# Patient Record
Sex: Female | Born: 1965
Health system: Southern US, Community
[De-identification: ages and names within clinical notes are randomized; demographics above are authoritative.]

## PROBLEM LIST (undated history)

## (undated) DIAGNOSIS — E785 Hyperlipidemia, unspecified: Secondary | ICD-10-CM

## (undated) DIAGNOSIS — M329 Systemic lupus erythematosus, unspecified: Secondary | ICD-10-CM

## (undated) DIAGNOSIS — IMO0002 Reserved for concepts with insufficient information to code with codable children: Secondary | ICD-10-CM

## (undated) DIAGNOSIS — E079 Disorder of thyroid, unspecified: Secondary | ICD-10-CM

## (undated) HISTORY — DX: Reserved for concepts with insufficient information to code with codable children: IMO0002

## (undated) HISTORY — PX: TOTAL ABDOMINAL HYSTERECTOMY: SHX209

## (undated) HISTORY — DX: Disorder of thyroid, unspecified: E07.9

## (undated) HISTORY — PX: BREAST ENHANCEMENT SURGERY: SHX7

## (undated) HISTORY — PX: TUBAL LIGATION: SHX77

## (undated) HISTORY — PX: OTHER SURGICAL HISTORY: SHX169

## (undated) HISTORY — PX: CHOLECYSTECTOMY: SHX55

## (undated) HISTORY — DX: Hyperlipidemia, unspecified: E78.5

## (undated) HISTORY — DX: Systemic lupus erythematosus, unspecified: M32.9

---

## 1998-06-08 ENCOUNTER — Inpatient Hospital Stay (HOSPITAL_COMMUNITY): Admission: AD | Admit: 1998-06-08 | Discharge: 1998-06-08 | Payer: Self-pay | Admitting: *Deleted

## 1999-04-08 ENCOUNTER — Other Ambulatory Visit: Admission: RE | Admit: 1999-04-08 | Discharge: 1999-04-08 | Payer: Self-pay | Admitting: *Deleted

## 2000-05-21 ENCOUNTER — Other Ambulatory Visit: Admission: RE | Admit: 2000-05-21 | Discharge: 2000-05-21 | Payer: Self-pay | Admitting: *Deleted

## 2000-10-29 ENCOUNTER — Inpatient Hospital Stay (HOSPITAL_COMMUNITY): Admission: AD | Admit: 2000-10-29 | Discharge: 2000-10-29 | Payer: Self-pay | Admitting: Obstetrics and Gynecology

## 2000-10-29 ENCOUNTER — Encounter: Payer: Self-pay | Admitting: Obstetrics and Gynecology

## 2001-02-09 ENCOUNTER — Inpatient Hospital Stay: Admission: AD | Admit: 2001-02-09 | Discharge: 2001-02-09 | Payer: Self-pay | Admitting: Obstetrics & Gynecology

## 2001-04-13 ENCOUNTER — Inpatient Hospital Stay (HOSPITAL_COMMUNITY): Admission: AD | Admit: 2001-04-13 | Discharge: 2001-04-15 | Payer: Self-pay | Admitting: Obstetrics & Gynecology

## 2001-04-20 ENCOUNTER — Encounter: Admission: RE | Admit: 2001-04-20 | Discharge: 2001-05-20 | Payer: Self-pay | Admitting: Obstetrics & Gynecology

## 2001-06-10 ENCOUNTER — Other Ambulatory Visit: Admission: RE | Admit: 2001-06-10 | Discharge: 2001-06-10 | Payer: Self-pay | Admitting: *Deleted

## 2002-03-01 ENCOUNTER — Emergency Department (HOSPITAL_COMMUNITY): Admission: EM | Admit: 2002-03-01 | Discharge: 2002-03-01 | Payer: Self-pay | Admitting: Emergency Medicine

## 2002-03-01 ENCOUNTER — Encounter: Payer: Self-pay | Admitting: Emergency Medicine

## 2002-04-12 ENCOUNTER — Other Ambulatory Visit: Admission: RE | Admit: 2002-04-12 | Discharge: 2002-04-12 | Payer: Self-pay | Admitting: *Deleted

## 2002-10-21 ENCOUNTER — Inpatient Hospital Stay (HOSPITAL_COMMUNITY): Admission: AD | Admit: 2002-10-21 | Discharge: 2002-10-26 | Payer: Self-pay | Admitting: Obstetrics & Gynecology

## 2002-10-22 ENCOUNTER — Encounter (INDEPENDENT_AMBULATORY_CARE_PROVIDER_SITE_OTHER): Payer: Self-pay | Admitting: *Deleted

## 2002-10-22 ENCOUNTER — Encounter: Payer: Self-pay | Admitting: Obstetrics & Gynecology

## 2002-11-03 ENCOUNTER — Inpatient Hospital Stay (HOSPITAL_COMMUNITY): Admission: AD | Admit: 2002-11-03 | Discharge: 2002-11-03 | Payer: Self-pay | Admitting: *Deleted

## 2002-12-19 ENCOUNTER — Other Ambulatory Visit: Admission: RE | Admit: 2002-12-19 | Discharge: 2002-12-19 | Payer: Self-pay | Admitting: *Deleted

## 2003-10-04 ENCOUNTER — Ambulatory Visit (HOSPITAL_COMMUNITY): Admission: RE | Admit: 2003-10-04 | Discharge: 2003-10-04 | Payer: Self-pay | Admitting: Neurology

## 2004-01-14 ENCOUNTER — Other Ambulatory Visit: Admission: RE | Admit: 2004-01-14 | Discharge: 2004-01-14 | Payer: Self-pay | Admitting: *Deleted

## 2005-03-30 ENCOUNTER — Other Ambulatory Visit: Admission: RE | Admit: 2005-03-30 | Discharge: 2005-03-30 | Payer: Self-pay | Admitting: Obstetrics and Gynecology

## 2005-07-17 ENCOUNTER — Encounter (INDEPENDENT_AMBULATORY_CARE_PROVIDER_SITE_OTHER): Payer: Self-pay | Admitting: Specialist

## 2005-07-17 ENCOUNTER — Ambulatory Visit (HOSPITAL_COMMUNITY): Admission: RE | Admit: 2005-07-17 | Discharge: 2005-07-17 | Payer: Self-pay | Admitting: Orthopedic Surgery

## 2005-07-17 ENCOUNTER — Ambulatory Visit (HOSPITAL_BASED_OUTPATIENT_CLINIC_OR_DEPARTMENT_OTHER): Admission: RE | Admit: 2005-07-17 | Discharge: 2005-07-17 | Payer: Self-pay | Admitting: Orthopedic Surgery

## 2006-05-31 ENCOUNTER — Emergency Department (HOSPITAL_COMMUNITY): Admission: EM | Admit: 2006-05-31 | Discharge: 2006-05-31 | Payer: Self-pay | Admitting: Emergency Medicine

## 2006-07-04 ENCOUNTER — Emergency Department (HOSPITAL_COMMUNITY): Admission: EM | Admit: 2006-07-04 | Discharge: 2006-07-05 | Payer: Self-pay | Admitting: Emergency Medicine

## 2007-07-21 ENCOUNTER — Encounter: Admission: RE | Admit: 2007-07-21 | Discharge: 2007-07-21 | Payer: Self-pay | Admitting: Obstetrics and Gynecology

## 2008-07-19 ENCOUNTER — Encounter: Admission: RE | Admit: 2008-07-19 | Discharge: 2008-07-19 | Payer: Self-pay | Admitting: Obstetrics and Gynecology

## 2008-08-15 ENCOUNTER — Encounter: Admission: RE | Admit: 2008-08-15 | Discharge: 2008-08-15 | Payer: Self-pay | Admitting: Family Medicine

## 2009-08-01 ENCOUNTER — Encounter: Admission: RE | Admit: 2009-08-01 | Discharge: 2009-08-01 | Payer: Self-pay | Admitting: Obstetrics and Gynecology

## 2010-08-04 ENCOUNTER — Encounter: Admission: RE | Admit: 2010-08-04 | Discharge: 2010-08-04 | Payer: Self-pay | Admitting: Obstetrics and Gynecology

## 2010-12-27 ENCOUNTER — Encounter: Payer: Self-pay | Admitting: *Deleted

## 2010-12-29 ENCOUNTER — Encounter: Payer: Self-pay | Admitting: Family Medicine

## 2011-04-24 NOTE — Op Note (Signed)
NAME:  [name], [name]                           ACCOUNT NO.:  [id number]   MEDICAL RECORD NO.:  [id number]                   PATIENT TYPE:  INP   LOCATION:  9148                                 FACILITY:  [facility]   PHYSICIAN:  Freddy Finner, M.D.                DATE OF BIRTH:  12-15-1965   DATE OF PROCEDURE:  10/22/2002  DATE OF DISCHARGE:                                 OPERATIVE REPORT   PREOPERATIVE DIAGNOSES:  1. Intrauterine pregnancy at 38-1/2 weeks' gestation.  2. Intrapartum hemorrhage.   POSTOPERATIVE DIAGNOSES:  1. Intrauterine pregnancy at 38-1/2 weeks' gestation.  2. Intrapartum hemorrhage.  3. Delivery of viable female infant with Apgar's 6 and 9 by neonatal     intensive care unit in attendance.  Cord pH 7.03   INDICATIONS FOR PROCEDURE:  The patient is a 45 year old, white, married  female, G4, P3 who presented in active labor.  At approximately 12:30 a.m.,  her request for epidural was made and this was placed.  The patient  apparently progressed very rapidly in labor.  She had a bleeding episode  which was fairly traumatic.  Dr. Colin Broach, who was in the hospital  visiting a patient two doors down, was summoned to the room after I was  called.  He ruptured her membranes with fluid that he thought was clear.  She was complete, but she was at a 0 station and he made the decision to  proceed with emergency cesarean.  I arrived approximately three minutes  after the birth of the infant at which time the infant was noted to be a  viable female with Apgar's of 6 and 9 were given by the NICU with cord pH of  7.03.  On my arrival, the patient was in excellent condition under epidural  anesthesia.  She was alert, oriented and animated.   DESCRIPTION OF PROCEDURE:  A lower transverse abdominal incision has been  made and carried sharply down to fascia in layers.  The fascia was then  extended in the transverse direction to the extended skin incision.  Rectus  muscles  had been divided in the midline.  Peritoneum was entered sharply.  The bladder flap was created with a sharp incision in the retroperitoneum  overlying the lower segment and was dissected off the lower segment.  A  transverse uterine incision had been made.  Placenta and other parts of  conception were removed from the uterus.  By Dr. Reynold Bowen description,  there was a small, marginal abruption.  I proceeded with completion of the  procedure at this point.  The uterine cavity was explored and found to be  empty.  The edges of the uterine incision were grasped with ring forceps.  The lower incision was closed with a first layer of running, locking 0  Vicryl.  The second layer with running, infiltrating 0 Monocryl.  One small  bleeder  a the left margin was controlled with figure-of-eight of 0 Monocryl.  Irrigation was carried out and hemostasis was complete. The bladder flap was  reapproximated with interrupted 0 Vicryl suture at the mid point of the  incision.  Tubes and ovaries were inspected and found to be normal.  The  uterus was normal.  At the patient's request, a tubal ligation was  performed.  This was confirmed appropriate by husband who signed the permit  due to her medicated status.  The tubes were elevated and the mid portion of  each tube was doubly ligated with 0 plain ties and a segment approximately 1  cm of tube excised.  The mucosal tips of the tubes distal to the ties were  fulgurated with the Bovie.  Hemostasis was adequate and complete.  The edges  of the peritoneal incision were grasped with Kelly's.  The abdominal  incision was then closed in layers.  Running 0 Monocryl was used to close  the peritoneum and reapproximate the rectus muscles.  The fascia was closed  with running, 0 PDS.  Subcutaneous tissue was closed with running, 2-0  plain.  Skin was closed with wide-skin staples and cornered with Steri-  Strips.  Estimated total intraoperative blood loss was 1000 cc  per  anesthesia.  The patient was taken to the recovery room in good condition.                                                Freddy Finner, M.D.    WRN/MEDQ  D:  10/22/2002  T:  10/22/2002  Job:  [id number]

## 2011-04-24 NOTE — Op Note (Signed)
NAMEKALANDRA, LOYE                           ACCOUNT NO.:  [id number]   MEDICAL RECORD NO.:  [id number]                   PATIENT TYPE:  INP   LOCATION:  9148                                 FACILITY:  [facility]   PHYSICIAN:  Timothy P. Fontaine, M.D.           DATE OF BIRTH:  [birth year]   DATE OF PROCEDURE:  10/22/2002  DATE OF DISCHARGE:                                 OPERATIVE REPORT   PROCEDURE:  While on labor and delivery attending another patient I was  asked to evaluate this patient, a patient of Dr. Varney Baas, who reportedly  had an uncomplicated labor until the acute onset of vaginal bleeding and  fetal bradycardia.  Dr. Jennette Kettle was summoned and while waiting his arrival I  was asked to evaluate the patient.  I found the patient on her side with  oxygen running, quickly evaluated her, found fetal heart rate in the 70s  with vaginal exam showing the patient to be complete, complete, with bulging  membranes.  The membranes were ruptured, the fluid noted to be clear, and  the infant was found to be in the vertex presentation at approximately a -1  to 0 station.  I asked the patient to push several times and it became  quickly apparent that there was not going to be a rapid descent to a  deliverable station vaginally and I quickly informed the patient that I felt  that we needed to proceed with an emergency cesarean section.  The patient  and her husband agreed with the plan and the patient was taken immediately  to the operating room, her epidural catheter being dosed upon arrival, the  abdomen quickly prepared with Betadine solution and draped in the usual  fashion.  The abdomen was sharply entered through a Pfannenstiel incision  achieving adequate hemostasis at all levels.  Bladder flap was sharply and  bluntly developed and the uterus was sharply entered in the lower uterine  segment and bluntly extended laterally.  The infant's head was then  delivered through the incision,  the nares and mouth suctioned, the rest of  the infant delivered, the cord doubly clamped and cut and the infant was  handed to pediatrics in attendance.  Samples of cord blood were obtained as  was an arterial cord pH and the placenta subsequently was spontaneously  extruded and noted to be intact.  I was unable to identify any gross areas  of abruption on the placenta although there was apparent dark old clotted  blood along the posterior lower uterine segment of the uterus.  The uterine  cavity was then explored with a sponge to remove all placental membrane  fragments and due to discomfort on the patient's level further surgery was  halted to allow the epidural to achieve maximum effect.  The patient was  also given 2 g Cefotan IV prophylaxis at this time due to the emergent  nature  of the cesarean.  Dr. Jennette Kettle arrived at this point and assumed primary  care of the patient and finished the procedure as the primary surgeon.  Apgars were later reported at 6 and 9 with an arterial cord pH of 7.06.                                               Timothy P. Audie Box, M.D.    TPF/MEDQ  D:  10/22/2002  T:  10/22/2002  Job:  [id number]

## 2011-04-24 NOTE — Op Note (Signed)
NAMECAPRIA, [name]                 ACCOUNT NO.:  [id number]   MEDICAL RECORD NO.:  [id number]          PATIENT TYPE:  AMB   LOCATION:  [facility]                         FACILITY:  [facility]   PHYSICIAN:  Marlowe Kays, M.D.  DATE OF BIRTH:  10-21-1966   DATE OF PROCEDURE:  07/17/2005  DATE OF DISCHARGE:                                 OPERATIVE REPORT   PREOPERATIVE DIAGNOSIS:  Ganglion cyst flexor radial right wrist.   POSTOPERATIVE DIAGNOSES:  Ganglion cyst flexor radial right wrist.   OPERATION:  Excision of ganglion flexor right wrist.   SURGEON:  Marlowe Kays, M.D.   ASSISTANT:  Nurse.   ANESTHESIA:  General.   PATHOLOGY AND JUSTIFICATION FOR PROCEDURE:  The cyst has been of long  standing perhaps as long as six months and is painful for her. She  understands because this is overlying the radial artery that we are doing  this under general anesthesia so I can release the tourniquet to control  bleeding postoperatively.   DESCRIPTION OF PROCEDURE:  Satisfied general anesthesia, pneumatic  tourniquet, right upper extremity was esmarched nonsterilely and prepped  from mid forearm to fingertips with DuraPrep, draped in a sterile field. I  made a vertical incision along the cyst with a combination of blunt and  sharp dissection. I isolated the cyst. It was intimately involved with a  small arterial branch and I dissected it out and then followed the cyst down  to where it appeared to take origin from the carpometacarpal joint. After  excising the cyst at its base, I then closed the aperture as carefully as I  could to avoid any arterial sticks and because there was sparse tissue with  interrupted 3-0 Vicryl. I then infiltrated the soft tissues with 1/2% plain  Marcaine and released the tourniquet. Several small minor bleeders were  coagulated with bipolar cautery. I then continued the closure with  interrupted 3-0 Vicryl subcutaneous tissue and running 4-0 nylon on the  skin.  Betadine Adaptic dry sterile dressing and volar plaster splint were  applied. She tolerated the procedure well and was taken to the recovery room  in satisfactory condition with no known complications.       JA/MEDQ  D:  07/17/2005  T:  07/17/2005  Job:  [id number]

## 2011-04-24 NOTE — Discharge Summary (Signed)
[name], [name]                           ACCOUNT NO.:  [id number]   MEDICAL RECORD NO.:  [id number]                   PATIENT TYPE:  INP   LOCATION:  9148                                 FACILITY:  [facility]   PHYSICIAN:  Dineen Kid. Rana Snare, M.D.                 DATE OF BIRTH:  [birth year]   DATE OF ADMISSION:  10/21/2002  DATE OF DISCHARGE:  10/26/2002                                 DISCHARGE SUMMARY   ADMITTING DIAGNOSES:  1. Intrauterine pregnancy at 38-1/2 weeks estimated gestational age.  2. Intrapartum hemorrhage.  3. Desires permanent sterilization.   DISCHARGE DIAGNOSES:  1. Status post low transverse cesarean section.  2. Viable female infant.  3. Permanent sterilization.   PROCEDURE:  1. Primary low transverse cesarean section.  2. Bilateral tubal ligation.   REASON FOR ADMISSION:  Please see written H&P.   HOSPITAL COURSE:  The patient is a 45 year old white married female gravida  4, para 3 that presented to [facility] in active labor.  Epidural anesthesia was placed for pain management.  Fetal heart tones were  reassuring.  The patient progressed rapidly in labor.  The patient suddenly  had an acute onset of vaginal bleeding and fetal bradycardia was observed.  Oxygen was administered and patient was rotated to the lateral position.  Dr. Audie Box, attending obstetrician who was nearby was summoned to the  room.  Dr. Jennette Kettle, on-call physician, was notified.  Artificial rupture of  membranes was performed with clear fluid noted.  Fetus was in the vertex  presentation at a -1 to a 0 station.  The patient was pushing but slow gain  was noted in station.  Decision was made to proceed with a cesarean  delivery.  The patient was taken emergently to the operating room where  epidural anesthesia was dosed adequately to a surgical level.  A low  transverse incision was made with delivery of a viable female infant  weighing 6 pounds 3 ounces with Apgars of 6 at  one minute, 9 at five  minutes.  Arterial cord pH was 7.30.  A small marginal abruption was noted.  The patient tolerated procedure well and was taken to the recovery room in  stable condition.  On postoperative day one abdomen was noted to be  distended with positive bowel sounds.  Abdominal dressing was clean, dry,  and intact.  Vital signs were stable.  Laboratories revealed hemoglobin of  7.9, platelet count of 121,000, WBC count of 11.4.  Dulcolax suppository was  administered.  On postoperative day two abdomen was soft.  The patient  continued to complain of abdominal distention.  Uterus was firm and  nontender.  Soap suds enema was given with small results.  On postoperative  day three slight erythema was noted above the incisional site.  Fundus was  slightly tender to palpation.  Oral antibiotics were started and Dulcolax  suppository was repeated.  On postoperative day four patient had good return  of bowel function.  Fundus was firm with some continued tenderness noted to  palpation, but improving.  Erythema remained superior to the incisional  site.  Staples were left intact and patient was discharged home.   CONDITION ON DISCHARGE:  Stable.   DIET:  Regular, as tolerated.   ACTIVITY:  No heavy lifting.  No driving x2 weeks.  No vaginal entry.   FOLLOW UP:  The patient is to follow up in the office in two days for staple  removal.  Otherwise, she is to return back to the office in one to two weeks  for an incision check.  She is to call for temperature greater than 100  degrees, persistent nausea and vomiting, heavy vaginal bleeding, and/or  redness or drainage from the incisional site.   DISCHARGE MEDICATIONS:  1. Percocet 5/325 numbers 30 one p.o. q.4-6h. p.r.n. pain.  2. Motrin 600 mg numbers 30 one p.o. q.6h. p.r.n.  3. Keflex 500 mg one p.o. t.i.d. x10 days.  4. Prenatal vitamins one p.o. daily.  5. Niferex 150 mg numbers 30 one p.o. daily.  6. Colace one p.o. daily  p.r.n.     Julio Sicks, N.P.                        Dineen Kid Rana Snare, M.D.    CC/MEDQ  D:  12/01/2002  T:  12/01/2002  Job:  [id number]

## 2011-11-11 ENCOUNTER — Other Ambulatory Visit: Payer: Self-pay | Admitting: Obstetrics and Gynecology

## 2011-11-11 DIAGNOSIS — Z1231 Encounter for screening mammogram for malignant neoplasm of breast: Secondary | ICD-10-CM

## 2011-12-11 ENCOUNTER — Ambulatory Visit: Payer: Self-pay

## 2015-12-23 ENCOUNTER — Other Ambulatory Visit: Payer: Self-pay | Admitting: Obstetrics and Gynecology

## 2015-12-23 DIAGNOSIS — R928 Other abnormal and inconclusive findings on diagnostic imaging of breast: Secondary | ICD-10-CM

## 2015-12-26 ENCOUNTER — Ambulatory Visit
Admission: RE | Admit: 2015-12-26 | Discharge: 2015-12-26 | Disposition: A | Discharge status: Home / Self Care | Payer: Managed Care, Other (non HMO) | Source: Ambulatory Visit | Attending: Obstetrics and Gynecology | Admitting: Obstetrics and Gynecology

## 2015-12-26 DIAGNOSIS — R928 Other abnormal and inconclusive findings on diagnostic imaging of breast: Secondary | ICD-10-CM

## 2016-01-15 ENCOUNTER — Other Ambulatory Visit: Payer: Self-pay

## 2017-02-23 ENCOUNTER — Telehealth: Payer: Self-pay | Admitting: Cardiology

## 2017-02-23 NOTE — Telephone Encounter (Signed)
Called pt and left message asking pt to call back to updated Fm and Medical Hx.

## 2017-03-04 ENCOUNTER — Encounter: Payer: Self-pay | Admitting: Cardiology

## 2017-03-04 ENCOUNTER — Telehealth (HOSPITAL_COMMUNITY): Payer: Self-pay

## 2017-03-04 ENCOUNTER — Encounter (INDEPENDENT_AMBULATORY_CARE_PROVIDER_SITE_OTHER): Payer: Self-pay

## 2017-03-04 ENCOUNTER — Ambulatory Visit (INDEPENDENT_AMBULATORY_CARE_PROVIDER_SITE_OTHER): Payer: Managed Care, Other (non HMO) | Admitting: Cardiology

## 2017-03-04 VITALS — BP 126/68 | HR 72 | Ht 60.5 in | Wt 148.0 lb

## 2017-03-04 DIAGNOSIS — Z8249 Family history of ischemic heart disease and other diseases of the circulatory system: Secondary | ICD-10-CM | POA: Diagnosis not present

## 2017-03-04 DIAGNOSIS — E782 Mixed hyperlipidemia: Secondary | ICD-10-CM

## 2017-03-04 DIAGNOSIS — F17200 Nicotine dependence, unspecified, uncomplicated: Secondary | ICD-10-CM | POA: Diagnosis not present

## 2017-03-04 DIAGNOSIS — R0609 Other forms of dyspnea: Secondary | ICD-10-CM

## 2017-03-04 MED ORDER — ROSUVASTATIN CALCIUM 5 MG PO TABS: 5.0000 mg | ORAL_TABLET | Freq: Every day | ORAL | 3 refills | Status: DC

## 2017-03-04 NOTE — Telephone Encounter (Signed)
Encounter complete. 

## 2017-03-04 NOTE — Patient Instructions (Signed)
Medication Instructions:   START TAKING ROSUVASTATIN 5 MG ONCE DAILY    Labwork:  TODAY--CMET  AND LIPIDS     Testing/Procedures:  Your physician has requested that you have en exercise stress myoview. For further information please visit [email or web address] Please follow instruction sheet, as given. DR Delton SeeNELSON WOULD LIKE FOR THIS TO BE SCHEDULED TOMORROW IF POSSIBLE, EITHER IN OUR OFFICE OR AT OUR [facility] OFFICE      Follow-Up:  2 MONTHS WITH DR Delton SeeNELSON       If you need a refill on your cardiac medications before your next appointment, please call your pharmacy.

## 2017-03-04 NOTE — Progress Notes (Signed)
Cardiology Office Note    Date:  03/04/2017   ID:  EMPRISS VASUDEVAN, DOB [birth year], MRN [id number]  PCP:  Turner Daniels, MD  Cardiologist:  Tobias Alexander, MD  Referring physician:   Turner Daniels, MD   Chief complain: DOE, FH of premature CAD  History of Present Illness:  [name] is a 51 y.o. female with h/o 25 years of smoking, SLE, untreated hyperlipidemia who is coming with concern of DOE. She has noticed it in the last 1-2 years but attributed it to smoking. She has 5 children. She had hysterectomy in 2004, no period for > 10 years.  She is not physically active and has sedentary job. She feels some chest pressure while having DOE. No palpitations or syncope. No LE edema, orthopnea, PND. She became scared after her younger sister died in 02/10/2018sec to MI and SCD. She was not symptomatic prior to that.   Past Medical History:  Diagnosis Date  . Thyroid disease     Past Surgical History:  Procedure Laterality Date  . BREAST ENHANCEMENT SURGERY Bilateral   . CESAREAN SECTION    . CHOLECYSTECTOMY    . TOTAL ABDOMINAL HYSTERECTOMY    . TUBAL LIGATION      Current Medications: No outpatient prescriptions prior to visit.   No facility-administered medications prior to visit.      Allergies:   Patient has no allergy information on record.   Social History   Social History  . Marital status: Married    Spouse name: N/A  . Number of children: N/A  . Years of education: N/A   Social History Main Topics  . Smoking status: Former Games developer  . Smokeless tobacco: Never Used  . Alcohol use Yes     Comment: occasionally  . Drug use: No  . Sexual activity: Not Asked   Other Topics Concern  . None   Social History Narrative  . None     Family History:  The patient'sfamily history includes Cancer in her mother; Heart disease in her father.   ROS:   Please see the history of present illness.    ROS All other systems reviewed and are negative.   PHYSICAL  EXAM:   VS:  BP 126/68   Pulse 72   Ht 5' 0.5" (1.537 m)   Wt 148 lb (67.1 kg)   BMI 28.43 kg/m    GEN: Well nourished, well developed, in no acute distress  HEENT: normal  Neck: no JVD, carotid bruits, or masses Cardiac: RRR; no murmurs, rubs, or gallops,no edema  Respiratory:  clear to auscultation bilaterally, normal work of breathing GI: soft, nontender, nondistended, + BS MS: no deformity or atrophy  Skin: warm and dry, no rash Neuro:  Alert and Oriented x 3, Strength and sensation are intact Psych: euthymic mood, full affect  Wt Readings from Last 3 Encounters:  03/04/17 148 lb (67.1 kg)      Studies/Labs Reviewed:   EKG:  EKG is ordered today.  Personally reviewed. The ekg ordered today demonstrates SR, poor R wave progression in the anterior leads..  Recent Labs: No results found for requested labs within last 8760 hours.   Lipid Panel No results found for: CHOL, TRIG, HDL, CHOLHDL, VLDL, LDLCALC, LDLDIRECT  Additional studies/ records that were reviewed today include:      ASSESSMENT:    1. DOE (dyspnea on exertion)   2. Family history of early CAD   3. Smoking   4. Mixed  hyperlipidemia      PLAN:  In order of problems listed above:  1. DOE - suspicious for ischemia - the patient has several risk factors including FH of premature CAD, untreated HLP, smoking and SLE. We will schedule an exercise treadmill nuclear stress test to evaluate for ischemia.  2. Smoking - encouraged to quit, she is motivated. 3. HLP - start rosuvastatin 5 mg po daily, repeat lipids and CMP in 2 months.    Medication Adjustments/Labs and Tests Ordered: Current medicines are reviewed at length with the patient today.  Concerns regarding medicines are outlined above.  Medication changes, Labs and Tests ordered today are listed in the Patient Instructions below. Patient Instructions  Medication Instructions:   START TAKING ROSUVASTATIN 5 MG ONCE  DAILY    Labwork:  TODAY--CMET  AND LIPIDS     Testing/Procedures:  Your physician has requested that you have en exercise stress myoview. For further information please visit [email or web address] Please follow instruction sheet, as given. DR Delton See WOULD LIKE FOR THIS TO BE SCHEDULED TOMORROW IF POSSIBLE, EITHER IN OUR OFFICE OR AT OUR [facility] OFFICE      Follow-Up:  2 MONTHS WITH DR Delton See       If you need a refill on your cardiac medications before your next appointment, please call your pharmacy.      Signed, Tobias Alexander, MD  03/04/2017 10:27 PM    [facility] [address] [place], [place], [place]  [address] Phone: [phone]; Fax: [phone]

## 2017-03-05 ENCOUNTER — Inpatient Hospital Stay (HOSPITAL_COMMUNITY): Admission: RE | Admit: 2017-03-05 | Payer: Managed Care, Other (non HMO) | Source: Ambulatory Visit

## 2017-03-09 ENCOUNTER — Telehealth: Payer: Self-pay | Admitting: *Deleted

## 2017-03-09 DIAGNOSIS — Z8249 Family history of ischemic heart disease and other diseases of the circulatory system: Secondary | ICD-10-CM

## 2017-03-09 DIAGNOSIS — R0609 Other forms of dyspnea: Principal | ICD-10-CM

## 2017-03-09 NOTE — Telephone Encounter (Signed)
Pts GXT is scheduled for 04/07/17 at 1100.  myoview appt cancelled.  Pt aware of appt date and time by [facility] scheduling.

## 2017-03-09 NOTE — Telephone Encounter (Signed)
-----   Message from Lars Masson, MD sent at 03/09/2017 10:53 AM EDT ----- Regarding: RE: Denied Myoview for 03-12-17 [name], Could you change it? Thank you K  ----- Message ----- From: Britt Bolognese Sent: 03/09/2017  10:06 AM To: Lars Masson, MD, Loa Socks, LPN Subject: Denied Myoview for 03-12-17                      Dr. Delton See  Pt's exercise Celine Ahr was denied by insurance d/t pt can walk on treadmill (your clinical notes were uploaded to insurance company).  Do you want to canx MPI and change to GXT?  Thanks  Charmaine

## 2017-03-09 NOTE — Telephone Encounter (Signed)
Spoke with the pt and informed her that per Dr. Delton See and our Pre-cert Representative, the pts exercise myoview ordered has been denied by her insurance company, for they want her to perform a regular GXT instead.  Informed the pt that we will cancel her myoview appt, and someone from our [facility] dept will be calling her to arrange her GXT appt.  Went over GXT instructions with the pt via phone.  Informed the pt that she may take all her meds with this test, but she can't eat, drink, or smoke tobacco 4 hours prior to this test.  Advised the pt to wear comfortable exercise clothing.  Pt verbalized understanding and agrees with this plan.

## 2017-03-12 ENCOUNTER — Encounter (HOSPITAL_COMMUNITY): Payer: Managed Care, Other (non HMO)

## 2017-04-07 ENCOUNTER — Ambulatory Visit (INDEPENDENT_AMBULATORY_CARE_PROVIDER_SITE_OTHER): Payer: Managed Care, Other (non HMO)

## 2017-04-07 DIAGNOSIS — R0609 Other forms of dyspnea: Secondary | ICD-10-CM

## 2017-04-07 DIAGNOSIS — Z8249 Family history of ischemic heart disease and other diseases of the circulatory system: Secondary | ICD-10-CM

## 2017-04-07 LAB — EXERCISE TOLERANCE TEST
Estimated workload: 7 METS
Exercise duration (min): 5 min
Exercise duration (sec): 0 s
MPHR: 170 {beats}/min
Peak HR: 160 {beats}/min
Percent HR: 94 %
RPE: 17
Rest HR: 81 {beats}/min

## 2017-04-13 ENCOUNTER — Encounter: Payer: Self-pay | Admitting: Cardiology

## 2017-04-30 ENCOUNTER — Ambulatory Visit: Payer: Managed Care, Other (non HMO) | Admitting: Cardiology

## 2017-05-05 ENCOUNTER — Encounter: Payer: Self-pay | Admitting: Cardiology

## 2017-05-05 DIAGNOSIS — K219 Gastro-esophageal reflux disease without esophagitis: Secondary | ICD-10-CM

## 2017-05-05 DIAGNOSIS — Z720 Tobacco use: Secondary | ICD-10-CM

## 2017-05-05 DIAGNOSIS — D735 Infarction of spleen: Secondary | ICD-10-CM | POA: Diagnosis not present

## 2017-05-05 DIAGNOSIS — R109 Unspecified abdominal pain: Secondary | ICD-10-CM | POA: Diagnosis not present

## 2017-05-05 DIAGNOSIS — M329 Systemic lupus erythematosus, unspecified: Secondary | ICD-10-CM

## 2017-05-06 DIAGNOSIS — M329 Systemic lupus erythematosus, unspecified: Secondary | ICD-10-CM | POA: Diagnosis not present

## 2017-05-06 DIAGNOSIS — D735 Infarction of spleen: Secondary | ICD-10-CM | POA: Diagnosis not present

## 2017-05-06 DIAGNOSIS — Z720 Tobacco use: Secondary | ICD-10-CM | POA: Diagnosis not present

## 2017-05-06 DIAGNOSIS — K219 Gastro-esophageal reflux disease without esophagitis: Secondary | ICD-10-CM | POA: Diagnosis not present

## 2017-05-07 DIAGNOSIS — M329 Systemic lupus erythematosus, unspecified: Secondary | ICD-10-CM | POA: Diagnosis not present

## 2017-05-07 DIAGNOSIS — D735 Infarction of spleen: Secondary | ICD-10-CM | POA: Diagnosis not present

## 2017-05-07 DIAGNOSIS — K219 Gastro-esophageal reflux disease without esophagitis: Secondary | ICD-10-CM | POA: Diagnosis not present

## 2017-05-07 DIAGNOSIS — Z720 Tobacco use: Secondary | ICD-10-CM | POA: Diagnosis not present

## 2017-05-08 DIAGNOSIS — D735 Infarction of spleen: Secondary | ICD-10-CM | POA: Diagnosis not present

## 2017-05-08 DIAGNOSIS — M329 Systemic lupus erythematosus, unspecified: Secondary | ICD-10-CM | POA: Diagnosis not present

## 2017-05-08 DIAGNOSIS — Z720 Tobacco use: Secondary | ICD-10-CM | POA: Diagnosis not present

## 2017-05-08 DIAGNOSIS — K219 Gastro-esophageal reflux disease without esophagitis: Secondary | ICD-10-CM | POA: Diagnosis not present

## 2019-10-11 DIAGNOSIS — M328 Other forms of systemic lupus erythematosus: Secondary | ICD-10-CM | POA: Diagnosis not present

## 2019-10-11 DIAGNOSIS — F330 Major depressive disorder, recurrent, mild: Secondary | ICD-10-CM | POA: Diagnosis not present

## 2019-10-11 DIAGNOSIS — Z0000 Encounter for general adult medical examination without abnormal findings: Secondary | ICD-10-CM | POA: Diagnosis not present

## 2019-10-11 DIAGNOSIS — Z7901 Long term (current) use of anticoagulants: Secondary | ICD-10-CM | POA: Diagnosis not present

## 2019-10-11 DIAGNOSIS — M329 Systemic lupus erythematosus, unspecified: Secondary | ICD-10-CM | POA: Diagnosis not present

## 2019-10-11 DIAGNOSIS — F329 Major depressive disorder, single episode, unspecified: Secondary | ICD-10-CM | POA: Diagnosis not present

## 2019-10-11 DIAGNOSIS — M79641 Pain in right hand: Secondary | ICD-10-CM | POA: Diagnosis not present

## 2019-10-11 DIAGNOSIS — E039 Hypothyroidism, unspecified: Secondary | ICD-10-CM | POA: Diagnosis not present

## 2019-10-12 DIAGNOSIS — Z0000 Encounter for general adult medical examination without abnormal findings: Secondary | ICD-10-CM | POA: Diagnosis not present

## 2019-11-08 DIAGNOSIS — M7061 Trochanteric bursitis, right hip: Secondary | ICD-10-CM | POA: Diagnosis not present

## 2019-11-08 DIAGNOSIS — M65311 Trigger thumb, right thumb: Secondary | ICD-10-CM | POA: Diagnosis not present

## 2019-11-08 DIAGNOSIS — M3500 Sicca syndrome, unspecified: Secondary | ICD-10-CM | POA: Diagnosis not present

## 2019-11-08 DIAGNOSIS — M2550 Pain in unspecified joint: Secondary | ICD-10-CM | POA: Diagnosis not present

## 2019-11-08 DIAGNOSIS — M329 Systemic lupus erythematosus, unspecified: Secondary | ICD-10-CM | POA: Diagnosis not present

## 2019-11-09 DIAGNOSIS — Z1509 Genetic susceptibility to other malignant neoplasm: Secondary | ICD-10-CM | POA: Diagnosis not present

## 2019-11-09 DIAGNOSIS — Z8481 Family history of carrier of genetic disease: Secondary | ICD-10-CM | POA: Diagnosis not present

## 2019-11-09 DIAGNOSIS — Z1273 Encounter for screening for malignant neoplasm of ovary: Secondary | ICD-10-CM | POA: Diagnosis not present

## 2019-11-10 DIAGNOSIS — S8001XA Contusion of right knee, initial encounter: Secondary | ICD-10-CM | POA: Diagnosis not present

## 2019-11-15 ENCOUNTER — Telehealth: Payer: Self-pay | Admitting: Hematology and Oncology

## 2019-11-15 NOTE — Telephone Encounter (Signed)
Received a new hem referral from Dr. Corinna Capra at [facility] for hx of splenic infarct. Ms. Hirt returned my call and has been scheduled to see Dr. Lindi Adie on 12/21 at 345pm. She's been made aware to arrive 15 minutes early.

## 2019-11-26 NOTE — Progress Notes (Signed)
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 female is here because of a history of splenic infarct. She is currently on anticoagulation with warfarin. Recent MyRisk genetic testing showed a 12.5% lifetime risk of breast cancer and was positive for the MSH6 gene. She has an extensive family history of cancer, and breast cancer in her maternal aunt, maternal cousin, and paternal cousin. She presents to the clinic today for initial evaluation.   I reviewed her records extensively and collaborated the history with the patient.  MEDICAL HISTORY:  Past Medical History:  Diagnosis Date  . Thyroid disease     SURGICAL HISTORY: Past Surgical History:  Procedure Laterality Date  . BREAST ENHANCEMENT SURGERY Bilateral   . CESAREAN SECTION    . CHOLECYSTECTOMY    . TOTAL ABDOMINAL HYSTERECTOMY    . TUBAL LIGATION      SOCIAL HISTORY: Social History   Socioeconomic History  . Marital status: Married    Spouse name: Not on file  . Number of children: Not on file  . Years of education: Not on file  . Highest education level: Not on file  Occupational History  . Not on file  Tobacco Use  . Smoking status: Former Research scientist (life sciences)  . Smokeless tobacco: Never Used  Substance and Sexual Activity  . Alcohol use: Yes    Comment: occasionally  . Drug use: No  . Sexual activity: Not on file  Other Topics Concern  . Not on file  Social History Narrative  . Not on file   Social Determinants of Health   Financial Resource Strain:   . Difficulty of Paying Living Expenses: Not on file  Food Insecurity:   . Worried About Charity fundraiser in the Last Year: Not on file  . Ran Out of Food  in the Last Year: Not on file  Transportation Needs:   . Lack of Transportation (Medical): Not on file  . Lack of Transportation (Non-Medical): Not on file  Physical Activity:   . Days of Exercise per Week: Not on file  . Minutes of Exercise per Session: Not on file  Stress:   . Feeling of Stress : Not on file  Social Connections:   . Frequency of Communication with Friends and Family: Not on file  . Frequency of Social Gatherings with Friends and Family: Not on file  . Attends Religious Services: Not on file  . Active Member of Clubs or Organizations: Not on file  . Attends Archivist Meetings: Not on file  . Marital Status: Not on file  Intimate Partner Violence:   . Fear of Current or Ex-Partner: Not on file  . Emotionally Abused: Not on file  . Physically Abused: Not on file  . Sexually Abused: Not on file    FAMILY HISTORY: Family History  Problem Relation Age of Onset  . Cancer Mother   . Heart disease Father     ALLERGIES:  has no allergies on file.  MEDICATIONS:  Current Outpatient Medications  Medication Sig Dispense Refill  . hydroxychloroquine (PLAQUENIL) 200 MG tablet Take 400 mg by mouth daily.  3  . levothyroxine (SYNTHROID, LEVOTHROID) 100 MCG tablet Take 100 mcg by mouth daily.  1  .  rosuvastatin (CRESTOR) 5 MG tablet Take 1 tablet (5 mg total) by mouth daily. 90 tablet 3  . Vitamin D, Ergocalciferol, (DRISDOL) 50000 units CAPS capsule Take 50,000 Units by mouth once a week.  0   No current facility-administered medications for this visit.    REVIEW OF SYSTEMS:   Constitutional: Denies fevers, chills or abnormal night sweats Eyes: Denies blurriness of vision, double vision or watery eyes Ears, nose, mouth, throat, and face: Denies mucositis or sore throat Respiratory: Denies cough, dyspnea or wheezes Cardiovascular: Denies palpitation, chest discomfort or lower extremity swelling Gastrointestinal:  Denies nausea, heartburn or change in bowel  habits Skin: Denies abnormal skin rashes Lymphatics: Denies new lymphadenopathy or easy bruising Neurological:Denies numbness, tingling or new weaknesses Behavioral/Psych: Mood is stable, no new changes  Breast: Denies any palpable lumps or discharge All other systems were reviewed with the patient and are negative.  PHYSICAL EXAMINATION: ECOG PERFORMANCE STATUS: 1 - Symptomatic but completely ambulatory  Vitals:   11/27/19 1603  BP: 133/88  Pulse: 78  Resp: 18  Temp: 98.2 F (36.8 C)  SpO2: 99%   Filed Weights   11/27/19 1603  Weight: 140 lb (63.5 kg)    GENERAL:alert, no distress and comfortable SKIN: skin color, texture, turgor are normal, no rashes or significant lesions EYES: normal, conjunctiva are pink and non-injected, sclera clear OROPHARYNX:no exudate, no erythema and lips, buccal mucosa, and tongue normal  NECK: supple, thyroid normal size, non-tender, without nodularity LYMPH:  no palpable lymphadenopathy in the cervical, axillary or inguinal LUNGS: clear to auscultation and percussion with normal breathing effort HEART: regular rate & rhythm and no murmurs and no lower extremity edema ABDOMEN:abdomen soft, non-tender and normal bowel sounds Musculoskeletal:no cyanosis of digits and no clubbing  PSYCH: alert & oriented x 3 with fluent speech NEURO: no focal motor/sensory deficits  LABORATORY DATA:  I have reviewed the data as listed No results found for: WBC, HGB, HCT, MCV, PLT No results found for: NA, K, CL, CO2  RADIOGRAPHIC STUDIES: I have personally reviewed the radiological reports and agreed with the findings in the report.  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  Data for breast cancer and MSH6 is not conclusive. Cancer risk: Patient has elevated risk of endometrial and colorectal cancer primarily, as well as ovarian gastric and pancreatic cancers. Patient is planning to undergo a hysterectomy for uterine cancer risk reduction. She is also planning to undergo colonoscopy.  Recommendation: 1.  GI follow-up with colonoscopies for colorectal cancer screening 2.  Breast MRI is not indicated because a lifetime risk is less than 20%.  However I recommended that she get annual breast mammograms.  Splenic infarction: With a history of lupus?  Lupus anticoagulant/antiphospholipid antibody We will send blood work today for lupus anticoagulant.  If it is positive then she will remain on warfarin for life.  She will then contact her primary care physician to set up the PT/INR checkups. If it is negative then we can discontinue warfarin.  I discussed with her that the newer anticoagulants do not work well for antiphospholipid antibody syndrome. I will give her a call tomorrow with  the results of the lupus anticoagulant testing.  Return to clinic on an as-needed basis.  All questions were answered. The patient knows to call the clinic with any problems, questions or concerns.   Rulon Eisenmenger, MD, MPH 11/27/2019    I, Molly Dorshimer, am acting as scribe for Nicholas Lose, MD.  I have reviewed the above documentation for accuracy and completeness, and I agree with the above.

## 2019-11-27 ENCOUNTER — Inpatient Hospital Stay: Payer: BC Managed Care – PPO | Attending: Hematology and Oncology | Admitting: Hematology and Oncology

## 2019-11-27 ENCOUNTER — Other Ambulatory Visit: Payer: Self-pay

## 2019-11-27 ENCOUNTER — Inpatient Hospital Stay: Payer: BC Managed Care – PPO

## 2019-11-27 ENCOUNTER — Ambulatory Visit: Payer: BC Managed Care – PPO

## 2019-11-27 DIAGNOSIS — Z7901 Long term (current) use of anticoagulants: Secondary | ICD-10-CM | POA: Insufficient documentation

## 2019-11-27 DIAGNOSIS — Z1509 Genetic susceptibility to other malignant neoplasm: Secondary | ICD-10-CM | POA: Insufficient documentation

## 2019-11-27 DIAGNOSIS — D735 Infarction of spleen: Secondary | ICD-10-CM | POA: Diagnosis not present

## 2019-11-27 NOTE — Assessment & Plan Note (Signed)
MSH6 heterozygous (Lynch syndrome) Lifetime breast cancer risk 12.7% Family history: Mother lung cancer 9, father prostate cancer 1, maternal aunt breast cancer 58, 2 maternal cousins with breast cancer 70 and 57, maternal uncle lung cancer 34  Counseling: I discussed with the patient that her family history does not correlate with the gene mutation that we detected.  Data for breast cancer and MSH6 is not conclusive. Cancer risk: Patient has elevated risk of endometrial and colorectal cancer primarily, as well as ovarian gastric and pancreatic cancers.  Recommendation: 1.  GI follow-up with colonoscopies for colorectal cancer screening 2.  GYN follow-up closely for endometrial cancer screening 2.  Given the extensive family history of breast cancer, I recommended antiestrogen therapy with tamoxifen for 5 years. Breast MRI is not indicated because a lifetime risk is less than 20%.  However I recommended that she get annual breast mammograms.

## 2019-11-29 ENCOUNTER — Telehealth: Payer: Self-pay | Admitting: Hematology and Oncology

## 2019-11-29 DIAGNOSIS — Z01818 Encounter for other preprocedural examination: Secondary | ICD-10-CM | POA: Diagnosis not present

## 2019-11-29 LAB — DRVVT MIX: dRVVT Mix: 43.3 s — ABNORMAL HIGH (ref 0.0–40.4)

## 2019-11-29 LAB — LUPUS ANTICOAGULANT PANEL
DRVVT: 77.9 s — ABNORMAL HIGH (ref 0.0–47.0)
PTT Lupus Anticoagulant: 50 s (ref 0.0–51.9)

## 2019-11-29 LAB — DRVVT CONFIRM: dRVVT Confirm: 1.5 ratio — ABNORMAL HIGH (ref 0.8–1.2)

## 2019-11-29 NOTE — Telephone Encounter (Signed)
I informed the patient that the lupus anticoagulant testing was positive for antiphospholipid antibodies. I recommended that she remain on blood thinners for life. We are happy to see her in the future if necessary.

## 2019-12-07 DIAGNOSIS — Z1211 Encounter for screening for malignant neoplasm of colon: Secondary | ICD-10-CM | POA: Diagnosis not present

## 2019-12-07 DIAGNOSIS — Z1509 Genetic susceptibility to other malignant neoplasm: Secondary | ICD-10-CM | POA: Diagnosis not present

## 2019-12-07 DIAGNOSIS — K648 Other hemorrhoids: Secondary | ICD-10-CM | POA: Diagnosis not present

## 2019-12-07 DIAGNOSIS — Z13810 Encounter for screening for upper gastrointestinal disorder: Secondary | ICD-10-CM | POA: Diagnosis not present

## 2019-12-07 DIAGNOSIS — K635 Polyp of colon: Secondary | ICD-10-CM | POA: Diagnosis not present

## 2019-12-07 DIAGNOSIS — D124 Benign neoplasm of descending colon: Secondary | ICD-10-CM | POA: Diagnosis not present

## 2019-12-27 DIAGNOSIS — M65311 Trigger thumb, right thumb: Secondary | ICD-10-CM | POA: Diagnosis not present

## 2020-01-18 DIAGNOSIS — Z7901 Long term (current) use of anticoagulants: Secondary | ICD-10-CM | POA: Diagnosis not present

## 2020-01-25 DIAGNOSIS — Z7901 Long term (current) use of anticoagulants: Secondary | ICD-10-CM | POA: Diagnosis not present

## 2020-02-08 DIAGNOSIS — Z7901 Long term (current) use of anticoagulants: Secondary | ICD-10-CM | POA: Diagnosis not present

## 2020-02-15 DIAGNOSIS — Z7901 Long term (current) use of anticoagulants: Secondary | ICD-10-CM | POA: Diagnosis not present

## 2020-03-18 DIAGNOSIS — Z7901 Long term (current) use of anticoagulants: Secondary | ICD-10-CM | POA: Diagnosis not present

## 2020-03-25 DIAGNOSIS — Z7901 Long term (current) use of anticoagulants: Secondary | ICD-10-CM | POA: Diagnosis not present

## 2020-04-01 DIAGNOSIS — Z7901 Long term (current) use of anticoagulants: Secondary | ICD-10-CM | POA: Diagnosis not present

## 2020-04-15 DIAGNOSIS — Z7901 Long term (current) use of anticoagulants: Secondary | ICD-10-CM | POA: Diagnosis not present

## 2020-05-08 DIAGNOSIS — G8918 Other acute postprocedural pain: Secondary | ICD-10-CM | POA: Diagnosis not present

## 2020-05-08 DIAGNOSIS — Z1509 Genetic susceptibility to other malignant neoplasm: Secondary | ICD-10-CM | POA: Diagnosis not present

## 2020-05-14 DIAGNOSIS — Z7901 Long term (current) use of anticoagulants: Secondary | ICD-10-CM | POA: Diagnosis not present

## 2020-05-14 DIAGNOSIS — M329 Systemic lupus erythematosus, unspecified: Secondary | ICD-10-CM | POA: Diagnosis not present

## 2020-05-29 DIAGNOSIS — Z7901 Long term (current) use of anticoagulants: Secondary | ICD-10-CM | POA: Diagnosis not present

## 2020-06-11 DIAGNOSIS — Z7901 Long term (current) use of anticoagulants: Secondary | ICD-10-CM | POA: Diagnosis not present

## 2020-06-13 DIAGNOSIS — Z79899 Other long term (current) drug therapy: Secondary | ICD-10-CM | POA: Diagnosis not present

## 2020-06-24 DIAGNOSIS — Z7901 Long term (current) use of anticoagulants: Secondary | ICD-10-CM | POA: Diagnosis not present

## 2020-07-31 ENCOUNTER — Other Ambulatory Visit: Payer: Self-pay | Admitting: Obstetrics and Gynecology

## 2020-07-31 DIAGNOSIS — Z808 Family history of malignant neoplasm of other organs or systems: Secondary | ICD-10-CM | POA: Diagnosis not present

## 2020-07-31 DIAGNOSIS — S3729XA Other injury of bladder, initial encounter: Secondary | ICD-10-CM | POA: Diagnosis not present

## 2020-07-31 DIAGNOSIS — N9971 Accidental puncture and laceration of a genitourinary system organ or structure during a genitourinary system procedure: Secondary | ICD-10-CM | POA: Diagnosis not present

## 2020-07-31 DIAGNOSIS — Z1502 Genetic susceptibility to malignant neoplasm of ovary: Secondary | ICD-10-CM | POA: Diagnosis not present

## 2020-07-31 DIAGNOSIS — Z4002 Encounter for prophylactic removal of ovary: Secondary | ICD-10-CM | POA: Diagnosis not present

## 2020-07-31 DIAGNOSIS — N994 Postprocedural pelvic peritoneal adhesions: Secondary | ICD-10-CM | POA: Diagnosis not present

## 2020-07-31 DIAGNOSIS — D270 Benign neoplasm of right ovary: Secondary | ICD-10-CM | POA: Diagnosis not present

## 2020-07-31 DIAGNOSIS — Z1509 Genetic susceptibility to other malignant neoplasm: Secondary | ICD-10-CM | POA: Diagnosis not present

## 2020-08-01 DIAGNOSIS — T83031A Leakage of indwelling urethral catheter, initial encounter: Secondary | ICD-10-CM | POA: Diagnosis not present

## 2020-08-05 DIAGNOSIS — Z7901 Long term (current) use of anticoagulants: Secondary | ICD-10-CM | POA: Diagnosis not present

## 2020-08-09 DIAGNOSIS — S3729XA Other injury of bladder, initial encounter: Secondary | ICD-10-CM | POA: Diagnosis not present

## 2020-08-26 DIAGNOSIS — Z7901 Long term (current) use of anticoagulants: Secondary | ICD-10-CM | POA: Diagnosis not present

## 2020-09-10 DIAGNOSIS — R319 Hematuria, unspecified: Secondary | ICD-10-CM | POA: Diagnosis not present

## 2020-09-10 DIAGNOSIS — Z09 Encounter for follow-up examination after completed treatment for conditions other than malignant neoplasm: Secondary | ICD-10-CM | POA: Diagnosis not present

## 2020-09-18 DIAGNOSIS — M3500 Sicca syndrome, unspecified: Secondary | ICD-10-CM | POA: Diagnosis not present

## 2020-09-18 DIAGNOSIS — M79644 Pain in right finger(s): Secondary | ICD-10-CM | POA: Diagnosis not present

## 2020-09-18 DIAGNOSIS — M329 Systemic lupus erythematosus, unspecified: Secondary | ICD-10-CM | POA: Diagnosis not present

## 2020-09-18 DIAGNOSIS — M7061 Trochanteric bursitis, right hip: Secondary | ICD-10-CM | POA: Diagnosis not present

## 2020-10-02 DIAGNOSIS — M7712 Lateral epicondylitis, left elbow: Secondary | ICD-10-CM | POA: Diagnosis not present

## 2020-10-05 NOTE — Progress Notes (Deleted)
Cardiology Office Note    Date:  10/05/2020   ID:  [name], DOB 08-19-1966, MRN [id number]  PCP:  Candice Camp, MD  Cardiologist:  No primary care provider on file.  Electrophysiologist:  None   Chief Complaint: ***  History of Present Illness:   [name] is a 54 y.o. female with history of ***  Labwork independently reviewed:    Past Medical History:  Diagnosis Date  . Thyroid disease     Past Surgical History:  Procedure Laterality Date  . BREAST ENHANCEMENT SURGERY Bilateral   . CESAREAN SECTION    . CHOLECYSTECTOMY    . TOTAL ABDOMINAL HYSTERECTOMY    . TUBAL LIGATION      Current Medications: No outpatient medications have been marked as taking for the 10/08/20 encounter (Appointment) with Laurann Montana, PA-C.   ***   Allergies:   Patient has no allergy information on record.   Social History   Socioeconomic History  . Marital status: Married    Spouse name: Not on file  . Number of children: Not on file  . Years of education: Not on file  . Highest education level: Not on file  Occupational History  . Not on file  Tobacco Use  . Smoking status: Former Games developer  . Smokeless tobacco: Never Used  Vaping Use  . Vaping Use: Never used  Substance and Sexual Activity  . Alcohol use: Yes    Comment: occasionally  . Drug use: No  . Sexual activity: Not on file  Other Topics Concern  . Not on file  Social History Narrative  . Not on file   Social Determinants of Health   Financial Resource Strain:   . Difficulty of Paying Living Expenses: Not on file  Food Insecurity:   . Worried About Programme researcher, broadcasting/film/video in the Last Year: Not on file  . Ran Out of Food in the Last Year: Not on file  Transportation Needs:   . Lack of Transportation (Medical): Not on file  . Lack of Transportation (Non-Medical): Not on file  Physical Activity:   . Days of Exercise per Week: Not on file  . Minutes of Exercise per Session: Not on file  Stress:   .  Feeling of Stress : Not on file  Social Connections:   . Frequency of Communication with Friends and Family: Not on file  . Frequency of Social Gatherings with Friends and Family: Not on file  . Attends Religious Services: Not on file  . Active Member of Clubs or Organizations: Not on file  . Attends Banker Meetings: Not on file  . Marital Status: Not on file     Family History:  The patient's ***family history includes Cancer in her mother; Heart disease in her father.  ROS:   Please see the history of present illness. Otherwise, review of systems is positive for ***.  All other systems are reviewed and otherwise negative.    EKGs/Labs/Other Studies Reviewed:    Studies reviewed are outlined and summarized above. Reports included below if pertinent.  ***    EKG:  EKG is ordered today, personally reviewed, demonstrating ***  Recent Labs: No results found for requested labs within last 8760 hours.  Recent Lipid Panel No results found for: CHOL, TRIG, HDL, CHOLHDL, VLDL, LDLCALC, LDLDIRECT  PHYSICAL EXAM:    VS:  There were no vitals taken for this visit.  BMI: There is no height or weight on file  to calculate BMI.  GEN: Well nourished, well developed, in no acute distress HEENT: normocephalic, atraumatic Neck: no JVD, carotid bruits, or masses Cardiac: ***RRR; no murmurs, rubs, or gallops, no edema  Respiratory:  clear to auscultation bilaterally, normal work of breathing GI: soft, nontender, nondistended, + BS MS: no deformity or atrophy Skin: warm and dry, no rash Neuro:  Alert and Oriented x 3, Strength and sensation are intact, follows commands Psych: euthymic mood, full affect  Wt Readings from Last 3 Encounters:  11/27/19 140 lb (63.5 kg)  03/04/17 148 lb (67.1 kg)     ASSESSMENT & PLAN:   1. ***  Disposition: F/u with ***   Medication Adjustments/Labs and Tests Ordered: Current medicines are reviewed at length with the patient today.   Concerns regarding medicines are outlined above. Medication changes, Labs and Tests ordered today are summarized above and listed in the Patient Instructions accessible in Encounters.   Signed, Laurann Montana, PA-C  10/05/2020 1:42 PM    [facility] [address] [place], [place], [place]  [address] Phone: [phone]; Fax: [phone]

## 2020-10-08 ENCOUNTER — Ambulatory Visit: Payer: BC Managed Care – PPO | Admitting: Physician Assistant

## 2020-10-10 DIAGNOSIS — Z7901 Long term (current) use of anticoagulants: Secondary | ICD-10-CM | POA: Diagnosis not present

## 2020-10-13 NOTE — Progress Notes (Deleted)
Cardiology Office Note:    Date:  10/13/2020   ID:  [name], DOB [birth year], MRN [id number]  PCP:  Candice Camp, MD  [facility] Cardiologist:  No primary care provider on file.  [facility] Electrophysiologist:  None   Referring MD: Candice Camp, MD    History of Present Illness:    [name] is a 54 y.o. female with a hx of tobacco use, SLE, splenic infarct on warfarin and HLD who was referred by Dr. Rana Snare for further evaluation of her DOE.  Patient last saw Dr. Delton See in 02/2017 when she presented with DOE and mild chest pain with exertion. Exercise stress normal without electrocardiographic evidence of ischemia.  Past Medical History:  Diagnosis Date  . Thyroid disease     Past Surgical History:  Procedure Laterality Date  . BREAST ENHANCEMENT SURGERY Bilateral   . CESAREAN SECTION    . CHOLECYSTECTOMY    . TOTAL ABDOMINAL HYSTERECTOMY    . TUBAL LIGATION      Current Medications: No outpatient medications have been marked as taking for the 10/15/20 encounter (Appointment) with Meriam Sprague, MD.     Allergies:   Patient has no allergy information on record.   Social History   Socioeconomic History  . Marital status: Married    Spouse name: Not on file  . Number of children: Not on file  . Years of education: Not on file  . Highest education level: Not on file  Occupational History  . Not on file  Tobacco Use  . Smoking status: Former Games developer  . Smokeless tobacco: Never Used  Vaping Use  . Vaping Use: Never used  Substance and Sexual Activity  . Alcohol use: Yes    Comment: occasionally  . Drug use: No  . Sexual activity: Not on file  Other Topics Concern  . Not on file  Social History Narrative  . Not on file   Social Determinants of Health   Financial Resource Strain:   . Difficulty of Paying Living Expenses: Not on file  Food Insecurity:   . Worried About Programme researcher, broadcasting/film/video in the Last Year: Not on file  . Ran Out of Food  in the Last Year: Not on file  Transportation Needs:   . Lack of Transportation (Medical): Not on file  . Lack of Transportation (Non-Medical): Not on file  Physical Activity:   . Days of Exercise per Week: Not on file  . Minutes of Exercise per Session: Not on file  Stress:   . Feeling of Stress : Not on file  Social Connections:   . Frequency of Communication with Friends and Family: Not on file  . Frequency of Social Gatherings with Friends and Family: Not on file  . Attends Religious Services: Not on file  . Active Member of Clubs or Organizations: Not on file  . Attends Banker Meetings: Not on file  . Marital Status: Not on file     Family History: The patient's ***family history includes Cancer in her mother; Heart disease in her father.  ROS:   Please see the history of present illness.    *** All other systems reviewed and are negative.  EKGs/Labs/Other Studies Reviewed:    The following studies were reviewed today: ECG stress 2018:  Blood pressure demonstrated a normal response to exercise.  There was no ST segment deviation noted during stress.   Normal ETT no ischemia   EKG:  EKG is ***  ordered today.  The ekg ordered today demonstrates ***  Recent Labs: No results found for requested labs within last 8760 hours.  Recent Lipid Panel No results found for: CHOL, TRIG, HDL, CHOLHDL, VLDL, LDLCALC, LDLDIRECT   Risk Assessment/Calculations:   {Does this patient have ATRIAL FIBRILLATION?[phone]}   Physical Exam:    VS:  There were no vitals taken for this visit.    Wt Readings from Last 3 Encounters:  11/27/19 140 lb (63.5 kg)  03/04/17 148 lb (67.1 kg)     GEN: *** Well nourished, well developed in no acute distress HEENT: Normal NECK: No JVD; No carotid bruits LYMPHATICS: No lymphadenopathy CARDIAC: ***RRR, no murmurs, rubs, gallops RESPIRATORY:  Clear to auscultation without rales, wheezing or rhonchi  ABDOMEN: Soft,  non-tender, non-distended MUSCULOSKELETAL:  No edema; No deformity  SKIN: Warm and dry NEUROLOGIC:  Alert and oriented x 3 PSYCHIATRIC:  Normal affect   ASSESSMENT:    No diagnosis found. PLAN:    In order of problems listed above:  1. ***    Shared Decision Making/Informed Consent      Medication Adjustments/Labs and Tests Ordered: Current medicines are reviewed at length with the patient today.  Concerns regarding medicines are outlined above.  No orders of the defined types were placed in this encounter.  No orders of the defined types were placed in this encounter.   There are no Patient Instructions on file for this visit.   Signed, Meriam Sprague, MD  10/13/2020 7:47 PM    [facility]

## 2020-10-15 ENCOUNTER — Ambulatory Visit: Payer: BC Managed Care – PPO | Admitting: Cardiology

## 2020-11-04 DIAGNOSIS — Z7901 Long term (current) use of anticoagulants: Secondary | ICD-10-CM | POA: Diagnosis not present

## 2020-11-09 NOTE — Progress Notes (Signed)
Cardiology Office Note:    Date:  11/11/2020   ID:  MAEVIS DUMITRESCU, DOB [birth year], MRN [id number]  PCP:  Candice Camp, MD  [facility] Cardiologist:  Meriam Sprague, MD  [facility] Electrophysiologist:  None   Referring MD: Candice Camp, MD    History of Present Illness:    [name] is a 54 y.o. 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 Delton See in 2018 now referred back to clinic by Dr. Rana Snare to re-establish care.  During her last visit in 2018, the patient was complaining of DOE and chest pressure. Underwent exercise stress test where she achieved 7 METs; no electrocardiographic evidence of ischemia. Has not followed up since that time.  Today, the patient states that she stopped taking her cholesterol medication (Crestor) as it was making her have muscle cramps with it. She stopped it about 4 months ago and is concerned as her sister passed away at age 31 from a massive MI. Currently she denies any chest pain, shortness of breath, palpitations, orthopnea, and PND. Not very active at baseline but does not note exercise restrictions. Has history of splenic infarct in setting of SLE found to be lupus anticoagulant positive now on lifelong warfarin. INR monitored hematologist. No further blood clots since that time. No issues with bleeding.  Continues to smoke 1/2ppd since 54 years of age. Has strong family history of lung cancer in relatives who smoked.  Sister passed away in her 3 from MI and SCD. Father with CAD.    Past Medical History:  Diagnosis Date  . Thyroid disease     Past Surgical History:  Procedure Laterality Date  . BREAST ENHANCEMENT SURGERY Bilateral   . CESAREAN SECTION    . CHOLECYSTECTOMY    . TOTAL ABDOMINAL HYSTERECTOMY    . TUBAL LIGATION      Current Medications: Current Meds  Medication Sig  . hydroxychloroquine (PLAQUENIL) 200 MG tablet Take 400 mg by mouth daily.  Marland Kitchen levothyroxine  (SYNTHROID, LEVOTHROID) 100 MCG tablet Take 100 mcg by mouth daily.     Allergies:   Patient has no allergy information on record.   Social History   Socioeconomic History  . Marital status: Married    Spouse name: Not on file  . Number of children: Not on file  . Years of education: Not on file  . Highest education level: Not on file  Occupational History  . Not on file  Tobacco Use  . Smoking status: Former Games developer  . Smokeless tobacco: Never Used  Vaping Use  . Vaping Use: Never used  Substance and Sexual Activity  . Alcohol use: Yes    Comment: occasionally  . Drug use: No  . Sexual activity: Not on file  Other Topics Concern  . Not on file  Social History Narrative  . Not on file   Social Determinants of Health   Financial Resource Strain:   . Difficulty of Paying Living Expenses: Not on file  Food Insecurity:   . Worried About Programme researcher, broadcasting/film/video in the Last Year: Not on file  . Ran Out of Food in the Last Year: Not on file  Transportation Needs:   . Lack of Transportation (Medical): Not on file  . Lack of Transportation (Non-Medical): Not on file  Physical Activity:   . Days of Exercise per Week: Not on file  . Minutes of Exercise per Session: Not on file  Stress:   . Feeling of Stress : Not on file  Social Connections:   . Frequency of Communication with Friends and Family: Not on file  . Frequency of Social Gatherings with Friends and Family: Not on file  . Attends Religious Services: Not on file  . Active Member of Clubs or Organizations: Not on file  . Attends Banker Meetings: Not on file  . Marital Status: Not on file     Family History: The patient's family history includes Cancer in her mother; Heart disease in her father.  ROS:   Please see the history of present illness.    Review of Systems  Constitutional: Negative for chills, fever and malaise/fatigue.  HENT: Negative for congestion.   Eyes: Negative for blurred vision.   Respiratory: Negative for shortness of breath.   Cardiovascular: Negative for chest pain, palpitations, orthopnea, claudication, leg swelling and PND.  Gastrointestinal: Negative for heartburn, nausea and vomiting.  Genitourinary: Negative for hematuria.  Musculoskeletal: Negative for falls.  Neurological: Negative for dizziness and loss of consciousness.  Endo/Heme/Allergies: Negative for polydipsia.  Psychiatric/Behavioral: Negative for substance abuse.    EKGs/Labs/Other Studies Reviewed:    The following studies were reviewed today: Exercise stress 2018: ECG Baseline ECG exhibits normal sinus rhythm..  Stress Findings The patient exercised following the Bruce protocol.   The patient reported no symptoms during the stress test. The patient experienced no angina during the stress test.   The patient requested the test to be stopped. The test was stopped because  the patient complained of fatigue and shortness of breath.   Blood pressure and heart rate demonstrated a normal response to exercise. Blood pressure demonstrated a normal response to exercise. Overall, the patient's exercise capacity was mildly impaired.   85% of maximum heart rate was achieved after 4.2 minutes.  Recovery time:  5 minutes.  The patient's response to exercise was adequate for diagnosis.  Response to Stress There was no ST segment deviation noted during stress.  Arrhythmias during stress:  none.   Arrhythmias during recovery:  none.     There were no significant arrhythmias noted during the test.   ECG was interpretable.    ECG 2018: NSR; no ischemia, no block  EKG:  EKG is  ordered today.  The ekg ordered today demonstrates NSR with HR 81  Recent Labs: No results found for requested labs within last 8760 hours.  Recent Lipid Panel No results found for: CHOL, TRIG, HDL, CHOLHDL, VLDL, LDLCALC, LDLDIRECT    Physical Exam:    VS:  BP 110/82   Pulse 81   Ht 5' (1.524 m)   Wt 142 lb 9.6 oz (64.7  kg)   SpO2 97%   BMI 27.85 kg/m     Wt Readings from Last 3 Encounters:  11/11/20 142 lb 9.6 oz (64.7 kg)  11/27/19 140 lb (63.5 kg)  03/04/17 148 lb (67.1 kg)     GEN:  Well nourished, well developed in no acute distress HEENT: Normal NECK: No JVD; No carotid bruits CARDIAC: RRR, no murmurs, rubs, gallops RESPIRATORY:  Clear to auscultation without rales, wheezing or rhonchi  ABDOMEN: Soft, non-tender, non-distended MUSCULOSKELETAL:  No edema; No deformity  SKIN: Warm and dry NEUROLOGIC:  Alert and oriented x 3 PSYCHIATRIC:  Normal affect   ASSESSMENT:    1. Hyperlipidemia, unspecified hyperlipidemia type   2. DOE (dyspnea on exertion)   3. Family history of early CAD   4. Hypothyroidism, unspecified type  PLAN:    In order of problems listed above:  #Family history of premature CAD #HLD Patient's sister died at 27 from massive MI. She has been diagnosed with HLD but stopped taking crestor due to muscles aches. Denies any current anginal symptoms but is notably at high risk with family history, SLE with positive lupus anticoagulant, and current tobacco use.  -Check lipid profile and hemoglobin A1C for risk stratification -Check coronary calcium score -Start lipitor 20mg  daily -Extensive time spent talking about importance of tobacco cessation  #SLE with history of splenic infarct. Managed by hematology now on lifelong [facility] with warfarin. No further arterial or venous thrombosis since starting AC. -Continue lifelong warfarin -Management per Hematology  #Tobacco abuse: Patient current smoker currently smoking 1/2 ppd. Discussed importance of smoking cessation at length. -Counseled regarding the importance of smoking cessation  #Hypothyroidism: -Check TSH -Continue synthroid   Medication Adjustments/Labs and Tests Ordered: Current medicines are reviewed at length with the patient today.  Concerns regarding medicines are outlined above.  Orders Placed This  Encounter  Procedures  . CT CARDIAC SCORING  . TSH  . Lipid Profile  . HgB A1c  . EKG 12-Lead   Meds ordered this encounter  Medications  . atorvastatin (LIPITOR) 20 MG tablet    Sig: Take 1 tablet (20 mg total) by mouth daily.    Dispense:  30 tablet    Refill:  11    Patient to stop Rosuvastatin    Patient Instructions  Medication Instructions:  Your physician has recommended you make the following change in your medication: Stop Rosuvastatin Start Atorvastatin 20 mg by mouth daily  *If you need a refill on your cardiac medications before your next appointment, please call your pharmacy*   Lab Work: Your physician recommends that you return for lab work in a few days--TSH, Hemoglobin A1C, Lipid profile.  This will be fasting  If you have labs (blood work) drawn today and your tests are completely normal, you will receive your results only by: Marland Kitchen MyChart Message (if you have MyChart) OR . A paper copy in the mail If you have any lab test that is abnormal or we need to change your treatment, we will call you to review the results.   Testing/Procedures: Dr Shari Prows has recommended you have a Coronary Calcium score CT   Follow-Up: At [facility], you and your health needs are our priority.  As part of our continuing mission to provide you with exceptional heart care, we have created designated Provider Care Teams.  These Care Teams include your primary Cardiologist (physician) and Advanced Practice Providers (APPs -  Physician Assistants and Nurse Practitioners) who all work together to provide you with the care you need, when you need it.  We recommend signing up for the patient portal called "MyChart".  Sign up information is provided on this After Visit Summary.  MyChart is used to connect with patients for Virtual Visits (Telemedicine).  Patients are able to view lab/test results, encounter notes, upcoming appointments, etc.  Non-urgent messages can be sent to your  provider as well.   To learn more about what you can do with MyChart, go to [email or web address].    Your next appointment:   6 month(s)   The format for your next appointment:   In Person  Provider:   You may see Meriam Sprague, MD or one of the following Advanced Practice Providers on your designated Care Team:    Tereso Newcomer, PA-C  Vin Frankfort,  PA-C    Other Instructions      Signed, Meriam Sprague, MD  11/11/2020 9:55 AM     [facility]

## 2020-11-11 ENCOUNTER — Ambulatory Visit (INDEPENDENT_AMBULATORY_CARE_PROVIDER_SITE_OTHER): Payer: Self-pay | Admitting: Cardiology

## 2020-11-11 ENCOUNTER — Other Ambulatory Visit: Payer: Self-pay

## 2020-11-11 ENCOUNTER — Encounter: Payer: Self-pay | Admitting: Cardiology

## 2020-11-11 VITALS — BP 110/82 | HR 81 | Ht 60.0 in | Wt 142.6 lb

## 2020-11-11 DIAGNOSIS — E785 Hyperlipidemia, unspecified: Secondary | ICD-10-CM

## 2020-11-11 DIAGNOSIS — R0600 Dyspnea, unspecified: Secondary | ICD-10-CM

## 2020-11-11 DIAGNOSIS — E039 Hypothyroidism, unspecified: Secondary | ICD-10-CM

## 2020-11-11 DIAGNOSIS — Z8249 Family history of ischemic heart disease and other diseases of the circulatory system: Secondary | ICD-10-CM

## 2020-11-11 DIAGNOSIS — R0609 Other forms of dyspnea: Secondary | ICD-10-CM

## 2020-11-11 MED ORDER — ATORVASTATIN CALCIUM 20 MG PO TABS: 20.0000 mg | ORAL_TABLET | Freq: Every day | ORAL | 11 refills | Status: DC

## 2020-11-11 NOTE — Patient Instructions (Signed)
Medication Instructions:  Your physician has recommended you make the following change in your medication: Stop Rosuvastatin Start Atorvastatin 20 mg by mouth daily  *If you need a refill on your cardiac medications before your next appointment, please call your pharmacy*   Lab Work: Your physician recommends that you return for lab work in a few days--TSH, Hemoglobin A1C, Lipid profile.  This will be fasting  If you have labs (blood work) drawn today and your tests are completely normal, you will receive your results only by: Marland Kitchen MyChart Message (if you have MyChart) OR . A paper copy in the mail If you have any lab test that is abnormal or we need to change your treatment, we will call you to review the results.   Testing/Procedures: Dr Shari Prows has recommended you have a Coronary Calcium score CT   Follow-Up: At [facility], you and your health needs are our priority.  As part of our continuing mission to provide you with exceptional heart care, we have created designated Provider Care Teams.  These Care Teams include your primary Cardiologist (physician) and Advanced Practice Providers (APPs -  Physician Assistants and Nurse Practitioners) who all work together to provide you with the care you need, when you need it.  We recommend signing up for the patient portal called "MyChart".  Sign up information is provided on this After Visit Summary.  MyChart is used to connect with patients for Virtual Visits (Telemedicine).  Patients are able to view lab/test results, encounter notes, upcoming appointments, etc.  Non-urgent messages can be sent to your provider as well.   To learn more about what you can do with MyChart, go to [email or web address].    Your next appointment:   6 month(s)   The format for your next appointment:   In Person  Provider:   You may see Meriam Sprague, MD or one of the following Advanced Practice Providers on your designated Care Team:    Tereso Newcomer, PA-C  Chelsea Aus, [place]    Other Instructions

## 2020-11-12 ENCOUNTER — Other Ambulatory Visit: Payer: Self-pay

## 2020-11-12 DIAGNOSIS — E663 Overweight: Secondary | ICD-10-CM | POA: Diagnosis not present

## 2020-11-12 DIAGNOSIS — Z6827 Body mass index (BMI) 27.0-27.9, adult: Secondary | ICD-10-CM | POA: Diagnosis not present

## 2020-11-12 DIAGNOSIS — R319 Hematuria, unspecified: Secondary | ICD-10-CM | POA: Diagnosis not present

## 2020-11-14 ENCOUNTER — Other Ambulatory Visit: Payer: Self-pay

## 2020-11-14 ENCOUNTER — Other Ambulatory Visit: Payer: BC Managed Care – PPO | Admitting: *Deleted

## 2020-11-14 DIAGNOSIS — Z8249 Family history of ischemic heart disease and other diseases of the circulatory system: Secondary | ICD-10-CM | POA: Diagnosis not present

## 2020-11-14 DIAGNOSIS — E039 Hypothyroidism, unspecified: Secondary | ICD-10-CM | POA: Diagnosis not present

## 2020-11-14 DIAGNOSIS — E785 Hyperlipidemia, unspecified: Secondary | ICD-10-CM

## 2020-11-15 LAB — LIPID PANEL
Chol/HDL Ratio: 5 ratio — ABNORMAL HIGH (ref 0.0–4.4)
Cholesterol, Total: 265 mg/dL — ABNORMAL HIGH (ref 100–199)
HDL: 53 mg/dL (ref >39)
LDL Chol Calc (NIH): 187 mg/dL — ABNORMAL HIGH (ref 0–99)
Triglycerides: 140 mg/dL (ref 0–149)
VLDL Cholesterol Cal: 25 mg/dL (ref 5–40)

## 2020-11-15 LAB — HEMOGLOBIN A1C
Est. average glucose Bld gHb Est-mCnc: 105 mg/dL
Hgb A1c MFr Bld: 5.3 % (ref 4.8–5.6)

## 2020-11-15 LAB — TSH: TSH: 12.4 u[IU]/mL — ABNORMAL HIGH (ref 0.450–4.500)

## 2020-11-18 ENCOUNTER — Telehealth: Payer: Self-pay

## 2020-11-18 DIAGNOSIS — Z7901 Long term (current) use of anticoagulants: Secondary | ICD-10-CM | POA: Diagnosis not present

## 2020-11-18 MED ORDER — LEVOTHYROXINE SODIUM 125 MCG PO TABS: 125.0000 ug | ORAL_TABLET | Freq: Every day | ORAL | 3 refills | Status: DC

## 2020-11-18 NOTE — Telephone Encounter (Signed)
Pt advised and verbalized understanding and will send her labs to her PCP, Dr. Rana Snare.

## 2020-11-18 NOTE — Telephone Encounter (Signed)
-----   Message from Meriam Sprague, MD sent at 11/16/2020  3:57 PM EST ----- Her TSH level is very elevated which suggests her synthroid dosing is too low. Can we increase her to daily and ensure she follows up with her primary care physician to adjust dosing further as needed?  Her cholesterol is also high. Her bad cholesterol or LDL level is 187. We will check to see what her calcium score is as this will help determine how much plaque is in the blood vessels. She should definitely take the lipitor as she needs it at this time. This medication will really help lower her cholesterol levels. We will watch this very closely going forward.

## 2020-11-21 DIAGNOSIS — Z79899 Other long term (current) drug therapy: Secondary | ICD-10-CM | POA: Diagnosis not present

## 2020-12-02 ENCOUNTER — Telehealth: Payer: Self-pay

## 2020-12-02 ENCOUNTER — Other Ambulatory Visit: Payer: Self-pay

## 2020-12-02 ENCOUNTER — Ambulatory Visit (INDEPENDENT_AMBULATORY_CARE_PROVIDER_SITE_OTHER)
Admission: RE | Admit: 2020-12-02 | Discharge: 2020-12-02 | Disposition: A | Discharge status: Home / Self Care | Payer: Self-pay | Source: Ambulatory Visit | Attending: Cardiology | Admitting: Cardiology

## 2020-12-02 DIAGNOSIS — E785 Hyperlipidemia, unspecified: Secondary | ICD-10-CM

## 2020-12-02 DIAGNOSIS — Z8249 Family history of ischemic heart disease and other diseases of the circulatory system: Secondary | ICD-10-CM

## 2020-12-02 DIAGNOSIS — R0609 Other forms of dyspnea: Secondary | ICD-10-CM

## 2020-12-02 DIAGNOSIS — R0600 Dyspnea, unspecified: Secondary | ICD-10-CM

## 2020-12-02 NOTE — Telephone Encounter (Signed)
The patient has been notified of the result and verbalized understanding.  All questions (if any) were answered. Leanord Hawking, RN 12/02/2020 3:26 PM

## 2020-12-02 NOTE — Telephone Encounter (Signed)
-----   Message from Meriam Sprague, MD sent at 12/02/2020  3:16 PM EST ----- Her calcium score is elevated at 102. This represents the 96% for age and sex matched controls. It is really important that she try to quit smoking as this and her positive family history are her primary risk factors. She should continue the atorvastatin and if she is tolerating it, we will slowly titrate it up to decrease her risk of future events as much as possible.

## 2021-01-07 DIAGNOSIS — Z7901 Long term (current) use of anticoagulants: Secondary | ICD-10-CM | POA: Diagnosis not present

## 2021-01-08 DIAGNOSIS — R590 Localized enlarged lymph nodes: Secondary | ICD-10-CM | POA: Diagnosis not present

## 2021-01-08 DIAGNOSIS — M545 Low back pain, unspecified: Secondary | ICD-10-CM | POA: Diagnosis not present

## 2021-01-08 DIAGNOSIS — I700 Atherosclerosis of aorta: Secondary | ICD-10-CM | POA: Diagnosis not present

## 2021-01-08 DIAGNOSIS — N6323 Unspecified lump in the left breast, lower outer quadrant: Secondary | ICD-10-CM | POA: Diagnosis not present

## 2021-01-08 DIAGNOSIS — R791 Abnormal coagulation profile: Secondary | ICD-10-CM | POA: Diagnosis not present

## 2021-01-08 DIAGNOSIS — R3121 Asymptomatic microscopic hematuria: Secondary | ICD-10-CM | POA: Diagnosis not present

## 2021-01-08 DIAGNOSIS — R109 Unspecified abdominal pain: Secondary | ICD-10-CM | POA: Diagnosis not present

## 2021-01-08 DIAGNOSIS — R110 Nausea: Secondary | ICD-10-CM | POA: Diagnosis not present

## 2021-01-08 DIAGNOSIS — N6320 Unspecified lump in the left breast, unspecified quadrant: Secondary | ICD-10-CM | POA: Diagnosis not present

## 2021-01-08 DIAGNOSIS — R319 Hematuria, unspecified: Secondary | ICD-10-CM | POA: Diagnosis not present

## 2021-01-08 DIAGNOSIS — R1032 Left lower quadrant pain: Secondary | ICD-10-CM | POA: Diagnosis not present

## 2021-01-10 ENCOUNTER — Other Ambulatory Visit: Payer: Self-pay | Admitting: Obstetrics and Gynecology

## 2021-01-10 DIAGNOSIS — N63 Unspecified lump in unspecified breast: Secondary | ICD-10-CM

## 2021-01-14 DIAGNOSIS — Z7901 Long term (current) use of anticoagulants: Secondary | ICD-10-CM | POA: Diagnosis not present

## 2021-01-23 DIAGNOSIS — Z7901 Long term (current) use of anticoagulants: Secondary | ICD-10-CM | POA: Diagnosis not present

## 2021-01-30 DIAGNOSIS — Z7901 Long term (current) use of anticoagulants: Secondary | ICD-10-CM | POA: Diagnosis not present

## 2021-02-18 DIAGNOSIS — Z7901 Long term (current) use of anticoagulants: Secondary | ICD-10-CM | POA: Diagnosis not present

## 2021-02-21 ENCOUNTER — Other Ambulatory Visit: Payer: Self-pay | Admitting: Obstetrics and Gynecology

## 2021-02-21 ENCOUNTER — Ambulatory Visit
Admission: RE | Admit: 2021-02-21 | Discharge: 2021-02-21 | Disposition: A | Discharge status: Home / Self Care | Payer: BC Managed Care – PPO | Source: Ambulatory Visit | Attending: Obstetrics and Gynecology | Admitting: Obstetrics and Gynecology

## 2021-02-21 ENCOUNTER — Other Ambulatory Visit: Payer: Self-pay

## 2021-02-21 DIAGNOSIS — N6310 Unspecified lump in the right breast, unspecified quadrant: Secondary | ICD-10-CM

## 2021-02-21 DIAGNOSIS — N63 Unspecified lump in unspecified breast: Secondary | ICD-10-CM

## 2021-02-21 DIAGNOSIS — R922 Inconclusive mammogram: Secondary | ICD-10-CM | POA: Diagnosis not present

## 2021-03-03 ENCOUNTER — Other Ambulatory Visit: Payer: Self-pay

## 2021-03-03 ENCOUNTER — Ambulatory Visit
Admission: RE | Admit: 2021-03-03 | Discharge: 2021-03-03 | Disposition: A | Discharge status: Home / Self Care | Payer: BC Managed Care – PPO | Source: Ambulatory Visit | Attending: Obstetrics and Gynecology | Admitting: Obstetrics and Gynecology

## 2021-03-03 DIAGNOSIS — N6313 Unspecified lump in the right breast, lower outer quadrant: Secondary | ICD-10-CM | POA: Diagnosis not present

## 2021-03-03 DIAGNOSIS — N6011 Diffuse cystic mastopathy of right breast: Secondary | ICD-10-CM | POA: Diagnosis not present

## 2021-03-03 DIAGNOSIS — N6310 Unspecified lump in the right breast, unspecified quadrant: Secondary | ICD-10-CM

## 2021-03-25 DIAGNOSIS — Z7901 Long term (current) use of anticoagulants: Secondary | ICD-10-CM | POA: Diagnosis not present

## 2021-04-19 IMAGING — MG MM  DIGITAL DIAGNOSTIC BREAST BILAT IMPLANT W/ TOMO W/ CAD
8 of 19 series · 8 of 39 positions shown · non-contrast
Comparison: Previous exam(s).

CLINICAL DATA: 54-year-old female with indeterminate asymmetric
tissue in the left breast seen on recent CT exam.

EXAM:
DIGITAL DIAGNOSTIC BILATERAL MAMMOGRAM WITH IMPLANTS, CAD AND
TOMOSYNTHESIS; ULTRASOUND RIGHT BREAST LIMITED
TECHNIQUE: Bilateral digital diagnostic mammography and breast tomosynthesis
was performed. The images were evaluated with computer-aided
detection. Standard and/or implant displaced views were performed.;
Targeted ultrasound examination of the right breast was performed

[R CC (1 of 2)]
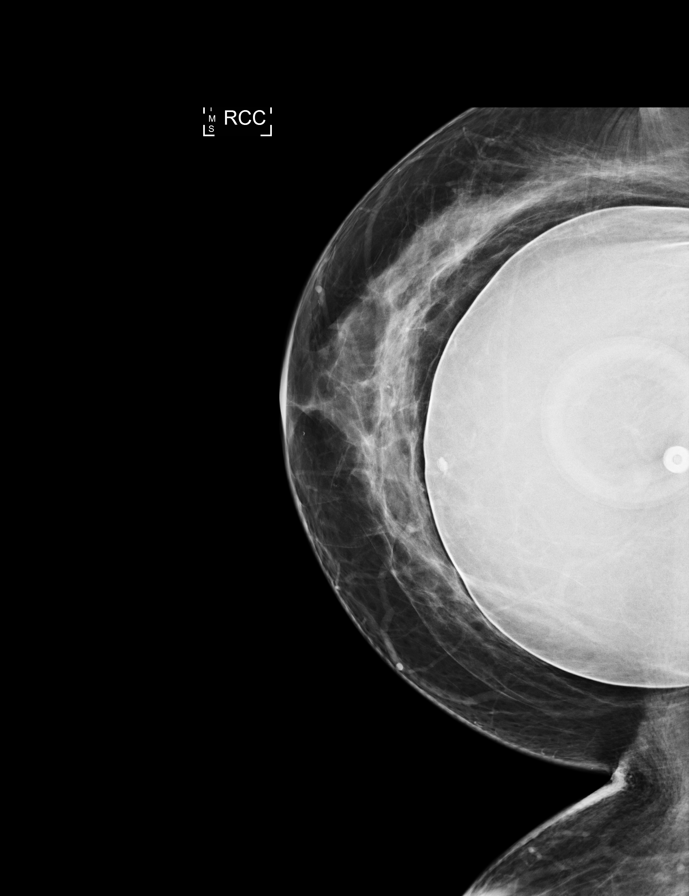

[L CC (1 of 2)]
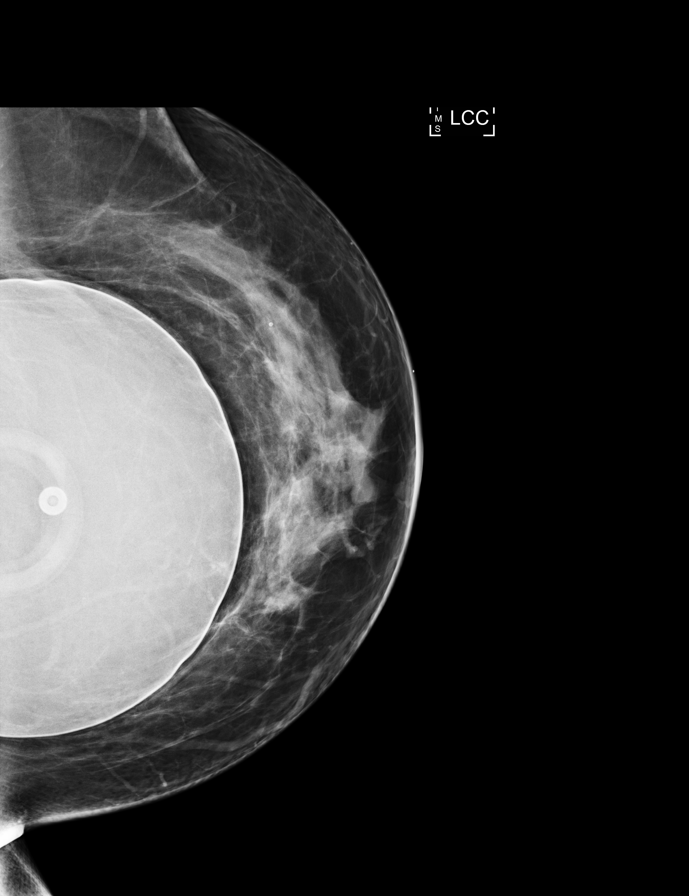

[R MLO (1 of 2)]
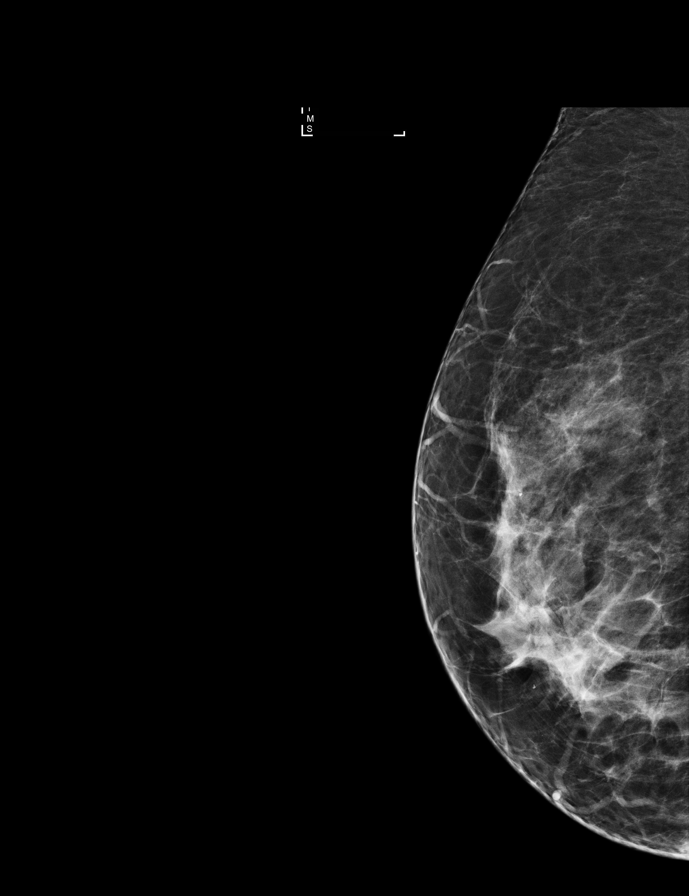

[L MLO (1 of 2)]
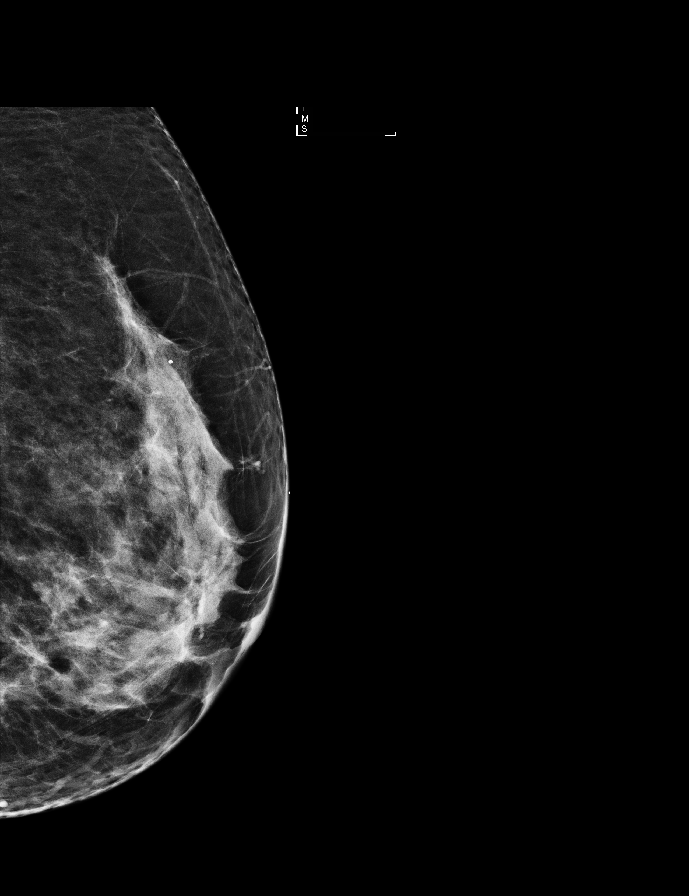

[R MLO (2 of 2)]
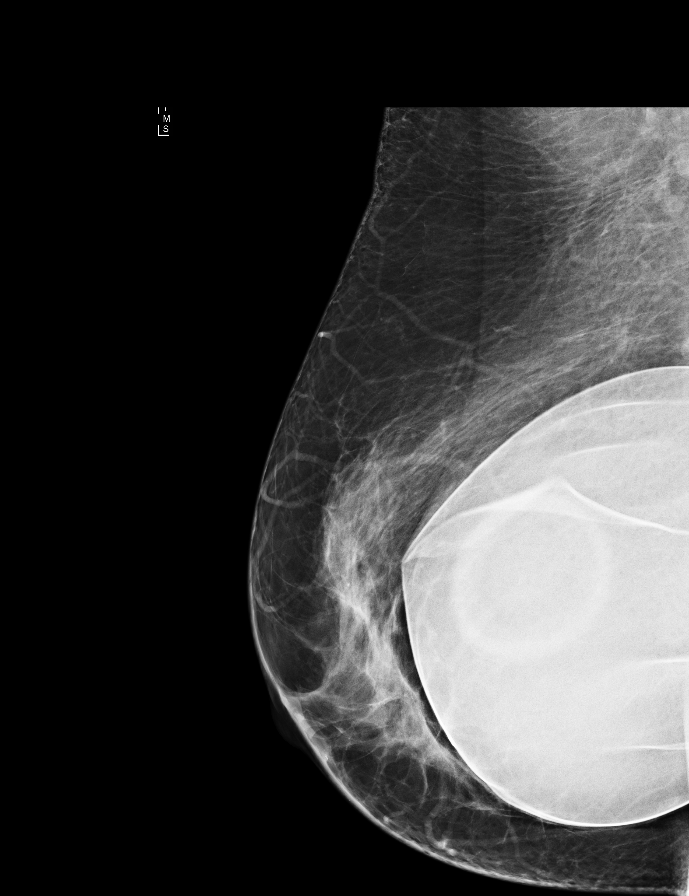

[R CC (2 of 2)]
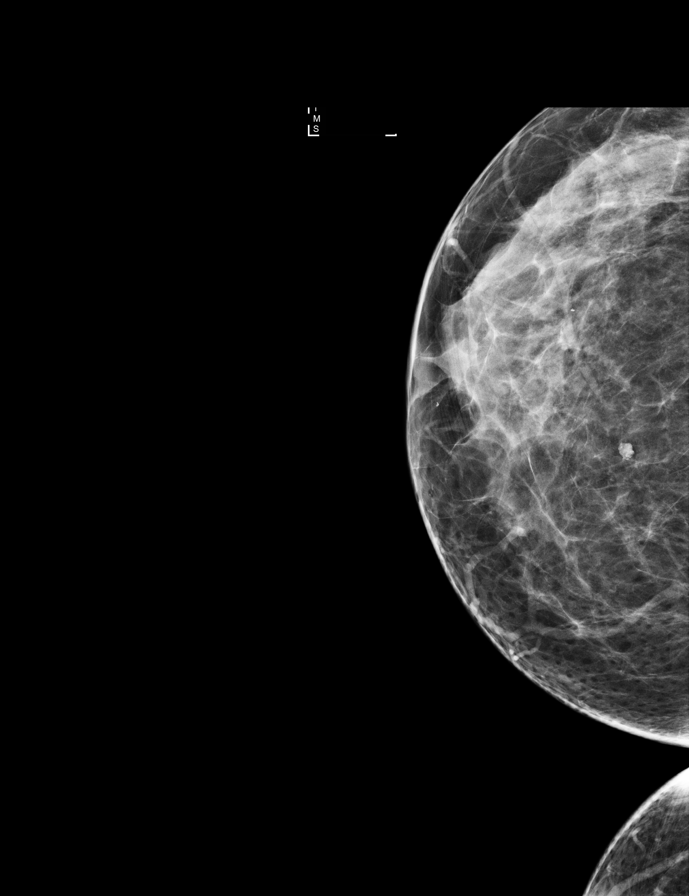

[L CC (2 of 2)]
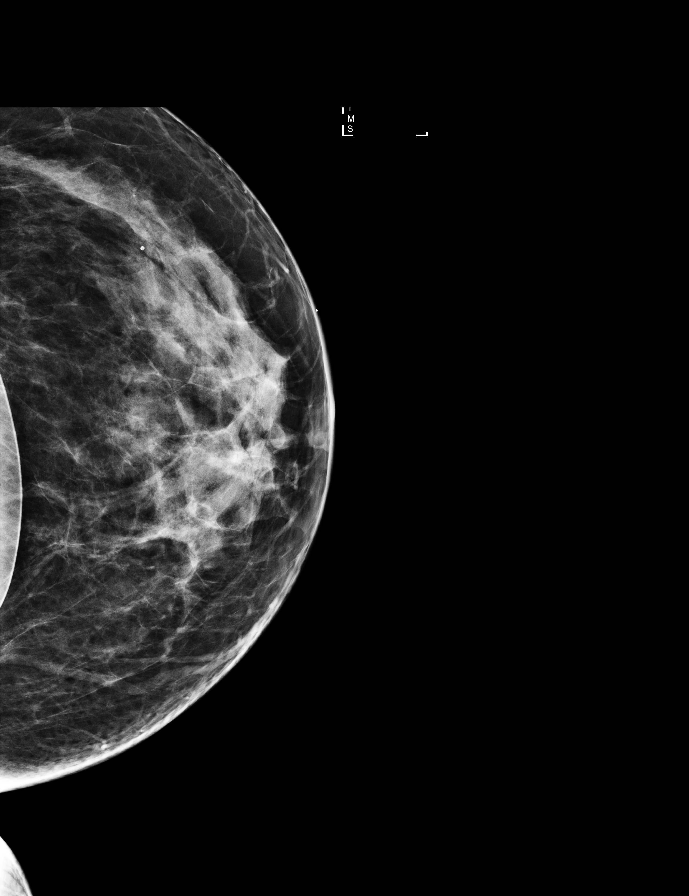

[L MLO (2 of 2)]
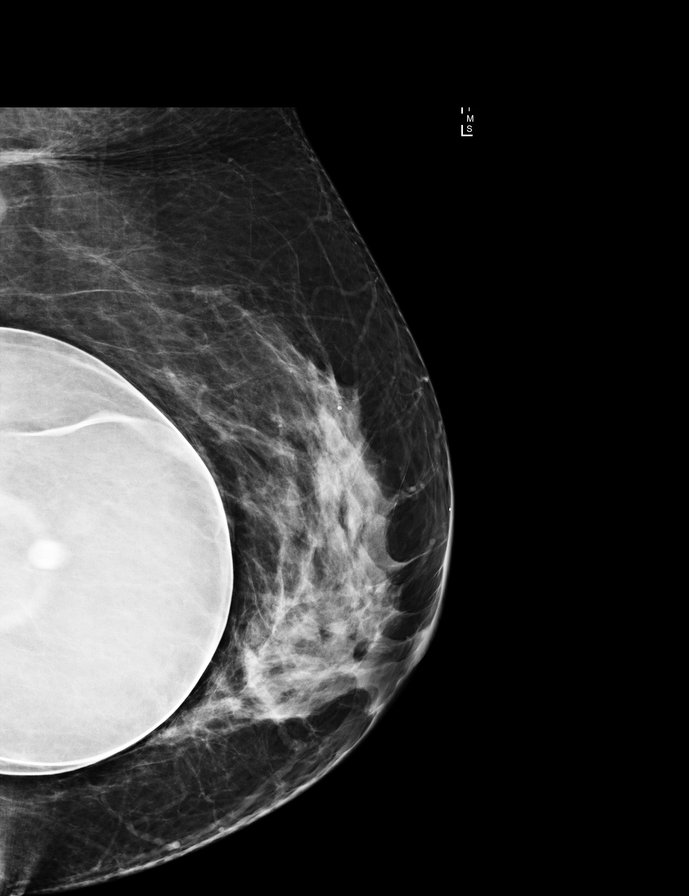

[8 of 39 positions shown; findings below may reference images not displayed]

ACR Breast Density Category c: The breast tissue is heterogeneously
dense, which may obscure small masses.
FINDINGS: No suspicious mammographic findings are identified within the left
breast. Previous CT findings are accounted for by the patient's
normal fibroglandular tissue.

An oval, circumscribed slightly lobulated mass with associated
calcifications is demonstrated in the central right breast on the
implant displaced CC projection only. This localizes inferiorly on
tomosynthesis. No definite correlate is identified on the MLO
projections. No additional suspicious findings in the remainder of
the right breast.

The patient has retropectoral implants.

Targeted ultrasound is performed, showing normal fibroglandular
tissue without focal or suspicious sonographic abnormality.
Evaluation of the deep central right breast was performed.
Evaluation of the right axilla demonstrates no suspicious
lymphadenopathy.
IMPRESSION: 1. Indeterminate right breast mass best visualized on the implant
displaced cc projection only. This localizes inferiorly on
tomosynthesis and there is no ultrasound correlate. Recommendation
is for stereotactic biopsy.
2. No suspicious right axillary lymphadenopathy.
3. No mammographic evidence of malignancy on the left. Prior CT if
findings are accounted for by the patient's heterogeneous
fibroglandular tissue pattern.

RECOMMENDATION:
Stereotactic biopsy of the right breast.

I have discussed the findings and recommendations with the patient.
If applicable, a reminder letter will be sent to the patient
regarding the next appointment.

BI-RADS CATEGORY  4: Suspicious.

## 2021-04-25 DIAGNOSIS — J069 Acute upper respiratory infection, unspecified: Secondary | ICD-10-CM | POA: Diagnosis not present

## 2021-04-25 DIAGNOSIS — J029 Acute pharyngitis, unspecified: Secondary | ICD-10-CM | POA: Diagnosis not present

## 2021-04-25 DIAGNOSIS — L255 Unspecified contact dermatitis due to plants, except food: Secondary | ICD-10-CM | POA: Diagnosis not present

## 2021-05-02 DIAGNOSIS — M329 Systemic lupus erythematosus, unspecified: Secondary | ICD-10-CM | POA: Diagnosis not present

## 2021-05-02 DIAGNOSIS — M3500 Sicca syndrome, unspecified: Secondary | ICD-10-CM | POA: Diagnosis not present

## 2021-05-02 DIAGNOSIS — M79644 Pain in right finger(s): Secondary | ICD-10-CM | POA: Diagnosis not present

## 2021-05-02 DIAGNOSIS — M7061 Trochanteric bursitis, right hip: Secondary | ICD-10-CM | POA: Diagnosis not present

## 2021-05-06 NOTE — Progress Notes (Deleted)
Cardiology Office Note:    Date:  05/06/2021   ID:  [name], DOB 09-13-1966, MRN [id number]  PCP:  Candice Camp, MD  [facility] Cardiologist:  Meriam Sprague, MD  [facility] Electrophysiologist:  None   Referring MD: Candice Camp, MD    History of Present Illness:    [name] is a 55 y.o. 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 Delton See in 2018 who now presents to clinic for CV follow-up.  In 2018, the patient was evaluated for DOE and chest pressure. Underwent exercise stress test where she achieved 7 METs; no electrocardiographic evidence of ischemia.   During last visit on 11/11/21,  the patient returned to clinic for CV evaluation as her sister passed away at age 34 from a massive MI. No symptoms at that time. Has history of splenic infarct in setting of SLE found to be lupus anticoagulant positive now on lifelong warfarin. INR monitored hematologist. We obtained a coronary calcium score 12/02/20 which was 102 in the RCA and LAD (96% for age and gender matched controls)  Today,   Continues to smoke 1/2ppd since 55 years of age. Has strong family history of lung cancer in relatives who smoked.     Past Medical History:  Diagnosis Date  . Thyroid disease     Past Surgical History:  Procedure Laterality Date  . BREAST ENHANCEMENT SURGERY Bilateral   . CESAREAN SECTION    . CHOLECYSTECTOMY    . TOTAL ABDOMINAL HYSTERECTOMY    . TUBAL LIGATION      Current Medications: No outpatient medications have been marked as taking for the 05/12/21 encounter (Appointment) with Meriam Sprague, MD.     Allergies:   Patient has no allergy information on record.   Social History   Socioeconomic History  . Marital status: Married    Spouse name: Not on file  . Number of children: Not on file  . Years of education: Not on file  . Highest education level: Not on file  Occupational History  . Not on file   Tobacco Use  . Smoking status: Former Games developer  . Smokeless tobacco: Never Used  Vaping Use  . Vaping Use: Never used  Substance and Sexual Activity  . Alcohol use: Yes    Comment: occasionally  . Drug use: No  . Sexual activity: Not on file  Other Topics Concern  . Not on file  Social History Narrative  . Not on file   Social Determinants of Health   Financial Resource Strain: Not on file  Food Insecurity: Not on file  Transportation Needs: Not on file  Physical Activity: Not on file  Stress: Not on file  Social Connections: Not on file     Family History: The patient's family history includes Cancer in her mother; Heart disease in her father.  ROS:   Please see the history of present illness.    Review of Systems  Constitutional: Negative for chills, fever and malaise/fatigue.  HENT: Negative for congestion.   Eyes: Negative for blurred vision.  Respiratory: Negative for shortness of breath.   Cardiovascular: Negative for chest pain, palpitations, orthopnea, claudication, leg swelling and PND.  Gastrointestinal: Negative for heartburn, nausea and vomiting.  Genitourinary: Negative for hematuria.  Musculoskeletal: Negative for falls.  Neurological: Negative for dizziness and loss of consciousness.  Endo/Heme/Allergies: Negative for polydipsia.  Psychiatric/Behavioral: Negative for substance abuse.    EKGs/Labs/Other Studies  Reviewed:    The following studies were reviewed today: Coronary Calcium 12/02/20: FINDINGS: Non-cardiac: See separate report from [facility].  Ascending Aorta: Normal caliber. Mild calcifications of the ascending aorta.  Pericardium: Normal  Coronary arteries: Normal coronary origins. Coronary Calcifications in the RCA and LAD.  IMPRESSION: Coronary calcium score of 102 in the RCA and LAD. This was 96th percentile for age and sex matched control.  Scattered calcifications in the ascending aorta consistent with aortic  atherosclerosis.  Recommend aggressive risk factor modifcation.  Exercise stress 2018: ECG Baseline ECG exhibits normal sinus rhythm..  Stress Findings The patient exercised following the Bruce protocol.   The patient reported no symptoms during the stress test. The patient experienced no angina during the stress test.   The patient requested the test to be stopped. The test was stopped because  the patient complained of fatigue and shortness of breath.   Blood pressure and heart rate demonstrated a normal response to exercise. Blood pressure demonstrated a normal response to exercise. Overall, the patient's exercise capacity was mildly impaired.   85% of maximum heart rate was achieved after 4.2 minutes.  Recovery time:  5 minutes.  The patient's response to exercise was adequate for diagnosis.  Response to Stress There was no ST segment deviation noted during stress.  Arrhythmias during stress:  none.   Arrhythmias during recovery:  none.     There were no significant arrhythmias noted during the test.   ECG was interpretable.    ECG 2018: NSR; no ischemia, no block  EKG:  EKG is  ordered today.  The ekg ordered today demonstrates NSR with HR 81  Recent Labs: 11/14/2020: TSH 12.400  Recent Lipid Panel    Component Value Date/Time   CHOL 265 (H) 11/14/2020 0815   TRIG 140 11/14/2020 0815   HDL 53 11/14/2020 0815   CHOLHDL 5.0 (H) 11/14/2020 0815   LDLCALC 187 (H) 11/14/2020 0815      Physical Exam:    VS:  There were no vitals taken for this visit.    Wt Readings from Last 3 Encounters:  11/11/20 142 lb 9.6 oz (64.7 kg)  11/27/19 140 lb (63.5 kg)  03/04/17 148 lb (67.1 kg)     GEN:  Well nourished, well developed in no acute distress HEENT: Normal NECK: No JVD; No carotid bruits CARDIAC: RRR, no murmurs, rubs, gallops RESPIRATORY:  Clear to auscultation without rales, wheezing or rhonchi  ABDOMEN: Soft, non-tender, non-distended MUSCULOSKELETAL:  No edema; No  deformity  SKIN: Warm and dry NEUROLOGIC:  Alert and oriented x 3 PSYCHIATRIC:  Normal affect   ASSESSMENT:    No diagnosis found. PLAN:    In order of problems listed above:  #Coronary artery disease without angina: #Family history of premature CAD #HLD Coronary calcium score 102 mainly in RCA and LAD. No anginal symptoms. Sister with massive MI at 37. Will continue atorvastatin and aggressive lifestyle modifications. -Continue lipitor 20mg  daily  -Diet and lifestyle modifications -Extensive time spent talking about importance of tobacco cessation  #SLE with history of splenic infarct. Managed by hematology now on lifelong [facility] with warfarin. No further arterial or venous thrombosis since starting AC. -Continue lifelong warfarin -Management per Hematology  #Tobacco abuse: Patient current smoker currently smoking 1/2 ppd. Discussed importance of smoking cessation at length. -Counseled regarding the importance of smoking cessation  #Hypothyroidism: -Continue synthroid daily   Medication Adjustments/Labs and Tests Ordered: Current medicines are reviewed at length with the patient today.  Concerns regarding medicines are outlined above.  No orders of the defined types were placed in this encounter.  No orders of the defined types were placed in this encounter.   There are no Patient Instructions on file for this visit.   Signed, Meriam Sprague, MD  05/06/2021 11:30 AM    [facility]

## 2021-05-12 ENCOUNTER — Ambulatory Visit: Payer: BC Managed Care – PPO | Admitting: Cardiology

## 2021-05-26 DIAGNOSIS — Z7901 Long term (current) use of anticoagulants: Secondary | ICD-10-CM | POA: Diagnosis not present

## 2021-06-04 NOTE — Progress Notes (Deleted)
Cardiology Office Note:    Date:  06/04/2021   ID:  [name], DOB December 15, 1965, MRN [id number]  PCP:  Laurie Camp, MD  [facility] Cardiologist:  Laurie Sprague, MD  [facility] Electrophysiologist:  None   Referring MD: Laurie Camp, MD    History of Present Illness:    [name] is a 55 y.o. 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 Delton See in 2018 who now presents to clinic for CV follow-up.  In 2018, the patient was evaluated for DOE and chest pressure. Underwent exercise stress test where she achieved 7 METs; no electrocardiographic evidence of ischemia.   During last visit on 11/11/21,  the patient returned to clinic for CV evaluation as her sister passed away at age 32 from a massive MI. No symptoms at that time. Has history of splenic infarct in setting of SLE found to be lupus anticoagulant positive now on lifelong warfarin. INR monitored hematologist. We obtained a coronary calcium score 12/02/20 which was 102 in the RCA and LAD (96% for age and gender matched controls)  Today,   Continues to smoke 1/2ppd since 55 years of age. Has strong family history of lung cancer in relatives who smoked.   Past Medical History:  Diagnosis Date   Thyroid disease     Past Surgical History:  Procedure Laterality Date   BREAST ENHANCEMENT SURGERY Bilateral    CESAREAN SECTION     CHOLECYSTECTOMY     TOTAL ABDOMINAL HYSTERECTOMY     TUBAL LIGATION      Current Medications: No outpatient medications have been marked as taking for the 06/06/21 encounter (Appointment) with Laurie Sprague, MD.     Allergies:   Patient has no allergy information on record.   Social History   Socioeconomic History   Marital status: Married    Spouse name: Not on file   Number of children: Not on file   Years of education: Not on file   Highest education level: Not on file  Occupational History   Not on file  Tobacco Use    Smoking status: Former    Pack years: 0.00   Smokeless tobacco: Never  Vaping Use   Vaping Use: Never used  Substance and Sexual Activity   Alcohol use: Yes    Comment: occasionally   Drug use: No   Sexual activity: Not on file  Other Topics Concern   Not on file  Social History Narrative   Not on file   Social Determinants of Health   Financial Resource Strain: Not on file  Food Insecurity: Not on file  Transportation Needs: Not on file  Physical Activity: Not on file  Stress: Not on file  Social Connections: Not on file     Family History: The patient's family history includes Cancer in her mother; Heart disease in her father.  ROS:   Please see the history of present illness.    Review of Systems  Constitutional:  Negative for chills, fever and malaise/fatigue.  HENT:  Negative for congestion.   Eyes:  Negative for blurred vision.  Respiratory:  Negative for shortness of breath.   Cardiovascular:  Negative for chest pain, palpitations, orthopnea, claudication, leg swelling and PND.  Gastrointestinal:  Negative for heartburn, nausea and vomiting.  Genitourinary:  Negative for hematuria.  Musculoskeletal:  Negative for falls.  Neurological:  Negative for dizziness and loss of consciousness.  Endo/Heme/Allergies:  Negative for  polydipsia.  Psychiatric/Behavioral:  Negative for substance abuse.    EKGs/Labs/Other Studies Reviewed:    The following studies were reviewed today: Coronary Calcium 12/02/20: FINDINGS: Non-cardiac: See separate report from [facility].   Ascending Aorta: Normal caliber. Mild calcifications of the ascending aorta.   Pericardium: Normal   Coronary arteries: Normal coronary origins. Coronary Calcifications in the RCA and LAD.   IMPRESSION: Coronary calcium score of 102 in the RCA and LAD. This was 96th percentile for age and sex matched control.   Scattered calcifications in the ascending aorta consistent with aortic  atherosclerosis.   Recommend aggressive risk factor modifcation.  Exercise stress 2018: ECG Baseline ECG exhibits normal sinus rhythm..  Stress Findings The patient exercised following the Bruce protocol.   The patient reported no symptoms during the stress test. The patient experienced no angina during the stress test.   The patient requested the test to be stopped. The test was stopped because  the patient complained of fatigue and shortness of breath.   Blood pressure and heart rate demonstrated a normal response to exercise. Blood pressure demonstrated a normal response to exercise. Overall, the patient's exercise capacity was mildly impaired.   85% of maximum heart rate was achieved after 4.2 minutes.  Recovery time:  5 minutes.  The patient's response to exercise was adequate for diagnosis.  Response to Stress There was no ST segment deviation noted during stress.  Arrhythmias during stress:  none.   Arrhythmias during recovery:  none.     There were no significant arrhythmias noted during the test.   ECG was interpretable.    ECG 2018: NSR; no ischemia, no block  EKG:  EKG is  ordered today.  The ekg ordered today demonstrates NSR with HR 81  Recent Labs: 11/14/2020: TSH 12.400  Recent Lipid Panel    Component Value Date/Time   CHOL 265 (H) 11/14/2020 0815   TRIG 140 11/14/2020 0815   HDL 53 11/14/2020 0815   CHOLHDL 5.0 (H) 11/14/2020 0815   LDLCALC 187 (H) 11/14/2020 0815      Physical Exam:    VS:  There were no vitals taken for this visit.    Wt Readings from Last 3 Encounters:  11/11/20 142 lb 9.6 oz (64.7 kg)  11/27/19 140 lb (63.5 kg)  03/04/17 148 lb (67.1 kg)     GEN:  Well nourished, well developed in no acute distress HEENT: Normal NECK: No JVD; No carotid bruits CARDIAC: RRR, no murmurs, rubs, gallops RESPIRATORY:  Clear to auscultation without rales, wheezing or rhonchi  ABDOMEN: Soft, non-tender, non-distended MUSCULOSKELETAL:  No edema; No  deformity  SKIN: Warm and dry NEUROLOGIC:  Alert and oriented x 3 PSYCHIATRIC:  Normal affect   ASSESSMENT:    No diagnosis found. PLAN:    In order of problems listed above:  #Coronary artery disease without angina: #Family history of premature CAD #HLD Coronary calcium score 102 mainly in RCA and LAD. No anginal symptoms. Sister with massive MI at 57. Will continue atorvastatin and aggressive lifestyle modifications. -Continue lipitor 20mg  daily  -Diet and lifestyle modifications -Extensive time spent talking about importance of tobacco cessation  #SLE with history of splenic infarct. Managed by hematology now on lifelong [facility] with warfarin. No further arterial or venous thrombosis since starting AC. -Continue lifelong warfarin -Management per Hematology  #Tobacco abuse: Patient current smoker currently smoking 1/2 ppd. Discussed importance of smoking cessation at length. -Counseled regarding the importance of smoking cessation  #Hypothyroidism: -Continue synthroid  daily   Medication Adjustments/Labs and Tests Ordered: Current medicines are reviewed at length with the patient today.  Concerns regarding medicines are outlined above.  No orders of the defined types were placed in this encounter.  No orders of the defined types were placed in this encounter.   There are no Patient Instructions on file for this visit.   Signed, Laurie Sprague, MD  06/04/2021 9:04 PM    [facility]

## 2021-06-06 ENCOUNTER — Ambulatory Visit (HOSPITAL_BASED_OUTPATIENT_CLINIC_OR_DEPARTMENT_OTHER): Payer: BC Managed Care – PPO | Admitting: Cardiology

## 2021-07-01 DIAGNOSIS — Z7901 Long term (current) use of anticoagulants: Secondary | ICD-10-CM | POA: Diagnosis not present

## 2021-07-09 DIAGNOSIS — Z7901 Long term (current) use of anticoagulants: Secondary | ICD-10-CM | POA: Diagnosis not present

## 2021-07-23 DIAGNOSIS — Z7901 Long term (current) use of anticoagulants: Secondary | ICD-10-CM | POA: Diagnosis not present

## 2021-08-06 DIAGNOSIS — M7712 Lateral epicondylitis, left elbow: Secondary | ICD-10-CM | POA: Diagnosis not present

## 2021-08-20 DIAGNOSIS — Z7901 Long term (current) use of anticoagulants: Secondary | ICD-10-CM | POA: Diagnosis not present

## 2021-09-03 DIAGNOSIS — Z7901 Long term (current) use of anticoagulants: Secondary | ICD-10-CM | POA: Diagnosis not present

## 2021-09-17 DIAGNOSIS — Z7901 Long term (current) use of anticoagulants: Secondary | ICD-10-CM | POA: Diagnosis not present

## 2021-09-25 NOTE — Progress Notes (Signed)
Cardiology Office Note   Date:  09/28/2021   ID:  [name], DOB 1965/12/27, MRN [id number]  PCP:  Candice Camp, MD  Cardiologist:  Dr. Shari Prows    Chief Complaint  Patient presents with   HLD      History of Present Illness: [name] is a 55 y.o. female who presents for elevated coronary calcium score. hx of smoking, SLE, splenic infarct on warfarin-followed by hematology, and HLD who last saw Dr. Delton See in 2018 now for follow up after visit with Dr. Shari Prows in Dec 2021.    In 2018 she had stress test for DOE and chest pressure  no ischemia on study.  In 11/2020 she had stooped her crestor due to muscle cramps.  Pt's sister died of MI and SCD at 73.  Pt also has FH of lung cancer and she continues to smoke.   Father with CAD.   She had coronary calcium score of 102 in RCA and LAD and normal coronary origins. Scattered calcifications in the ascending aorta consistent with aortic atherosclerosis. Recommended she stop tobacco and continue atorvastatin.  Her LDL in Dec was 187 so lipitor was continues.  Her TSh was elevated and followed by PCP.    Today she denies any chest pain or SOB.  She is using Nicoderm gum and patch and only having 1-2 cigarettes per week.  Much improved. No bleeding on the coumadin.  Does have neck pain more on left side, she would like carotid dopplers to eval.     Past Medical History:  Diagnosis Date   Thyroid disease     Past Surgical History:  Procedure Laterality Date   BREAST ENHANCEMENT SURGERY Bilateral    CESAREAN SECTION     CHOLECYSTECTOMY     TOTAL ABDOMINAL HYSTERECTOMY     TUBAL LIGATION       Current Outpatient Medications  Medication Sig Dispense Refill   atorvastatin (LIPITOR) 20 MG tablet Take 1 tablet (20 mg total) by mouth daily. 30 tablet 11   hydroxychloroquine (PLAQUENIL) 200 MG tablet Take 400 mg by mouth daily.  3   levothyroxine (SYNTHROID) 125 MCG tablet Take 1 tablet (125 mcg total) by mouth daily before  breakfast. 90 tablet 3   warfarin (COUMADIN) 7.5 MG tablet Take 7.5 mg by mouth daily.     No current facility-administered medications for this visit.    Allergies:   Patient has no allergy information on record.    Social History:  The patient  reports that she has quit smoking. She has never used smokeless tobacco. She reports current alcohol use. She reports that she does not use drugs.   Family History:  The patient's family history includes Cancer in her mother; Heart disease in her father.    ROS:  General:no colds or fevers, no weight changes Skin:no rashes or ulcers HEENT:no blurred vision, no congestion CV:see HPI PUL:see HPI GI:no diarrhea constipation or melena, no indigestion GU:no hematuria, no dysuria MS:no joint pain, no claudication Neuro:no syncope, no lightheadedness Endo:no diabetes, no thyroid disease  Wt Readings from Last 3 Encounters:  09/26/21 151 lb 9.6 oz (68.8 kg)  11/11/20 142 lb 9.6 oz (64.7 kg)  11/27/19 140 lb (63.5 kg)     PHYSICAL EXAM: VS:  BP 102/72   Pulse 83   Ht 5' (1.524 m)   Wt 151 lb 9.6 oz (68.8 kg)   SpO2 99%   BMI 29.61 kg/m  , BMI Body mass index is 29.61  kg/m. General:Pleasant affect, NAD Skin:Warm and dry, brisk capillary refill HEENT:normocephalic, sclera clear, mucus membranes moist Neck:supple, no JVD, no bruits  Heart:S1S2 RRR without murmur, gallup, rub or click Lungs:clear without rales, rhonchi, or wheezes WUJ:WJXB, non tender, + BS, do not palpate liver spleen or masses Ext:no lower ext edema, 2+ pedal pulses, 2+ radial pulses Neuro:alert and oriented, MAE, follows commands, + facial symmetry    EKG:  EKG is NOT ordered today.   Recent Labs: 09/26/2021: ALT 26; BUN 11; Creatinine, Ser 0.80; Potassium 4.1; Sodium 143; TSH 0.052    Lipid Panel    Component Value Date/Time   CHOL 160 09/26/2021 0842   TRIG 133 09/26/2021 0842   HDL 46 09/26/2021 0842   CHOLHDL 3.5 09/26/2021 0842   LDLCALC 90  09/26/2021 0842       Other studies Reviewed: Additional studies/ records that were reviewed today include:  ETT 04/07/17  .Blood pressure demonstrated a normal response to exercise. There was no ST segment deviation noted during stress.   Normal ETT no ischemia     ASSESSMENT AND PLAN:  1.  HLD on statin will recheck hepatic and lipid panel.  No angina no DOE. 2.  FH of early CAD, no angina 3.  Tobacco use has almost stopped. 4.  Hypothyroidism per PCP will check TSH today 5.  Neck pain, concern for carotid disease and occ dizziness. Check carotid dopplers.   Current medicines are reviewed with the patient today.  The patient Has no concerns regarding medicines.  The following changes have been made:  See above Labs/ tests ordered today include:see above  Disposition:   FU:  see above  Signed, Nada Boozer, NP  09/28/2021 9:05 PM    [facility] [address] [place], [place], [place]  [address]/ [address] [place], [place] Phone: [phone]; Fax: [phone]  [phone]

## 2021-09-26 ENCOUNTER — Ambulatory Visit (INDEPENDENT_AMBULATORY_CARE_PROVIDER_SITE_OTHER): Payer: BC Managed Care – PPO | Admitting: Cardiology

## 2021-09-26 ENCOUNTER — Other Ambulatory Visit: Payer: Self-pay

## 2021-09-26 ENCOUNTER — Encounter: Payer: Self-pay | Admitting: Cardiology

## 2021-09-26 VITALS — BP 102/72 | HR 83 | Ht 60.0 in | Wt 151.6 lb

## 2021-09-26 DIAGNOSIS — E039 Hypothyroidism, unspecified: Secondary | ICD-10-CM | POA: Diagnosis not present

## 2021-09-26 DIAGNOSIS — Z8249 Family history of ischemic heart disease and other diseases of the circulatory system: Secondary | ICD-10-CM | POA: Diagnosis not present

## 2021-09-26 DIAGNOSIS — R42 Dizziness and giddiness: Secondary | ICD-10-CM | POA: Diagnosis not present

## 2021-09-26 DIAGNOSIS — E785 Hyperlipidemia, unspecified: Secondary | ICD-10-CM

## 2021-09-26 LAB — LIPID PANEL
Chol/HDL Ratio: 3.5 ratio (ref 0.0–4.4)
Cholesterol, Total: 160 mg/dL (ref 100–199)
HDL: 46 mg/dL (ref >39)
LDL Chol Calc (NIH): 90 mg/dL (ref 0–99)
Triglycerides: 133 mg/dL (ref 0–149)
VLDL Cholesterol Cal: 24 mg/dL (ref 5–40)

## 2021-09-26 LAB — COMPREHENSIVE METABOLIC PANEL
ALT: 26 IU/L (ref 0–32)
AST: 27 IU/L (ref 0–40)
Albumin/Globulin Ratio: 1.7 (ref 1.2–2.2)
Albumin: 4.5 g/dL (ref 3.8–4.9)
Alkaline Phosphatase: 104 IU/L (ref 44–121)
BUN/Creatinine Ratio: 14 (ref 9–23)
BUN: 11 mg/dL (ref 6–24)
Bilirubin Total: 0.3 mg/dL (ref 0.0–1.2)
CO2: 22 mmol/L (ref 20–29)
Calcium: 9.6 mg/dL (ref 8.7–10.2)
Chloride: 105 mmol/L (ref 96–106)
Creatinine, Ser: 0.8 mg/dL (ref 0.57–1.00)
Globulin, Total: 2.6 g/dL (ref 1.5–4.5)
Glucose: 71 mg/dL (ref 70–99)
Potassium: 4.1 mmol/L (ref 3.5–5.2)
Sodium: 143 mmol/L (ref 134–144)
Total Protein: 7.1 g/dL (ref 6.0–8.5)
eGFR: 87 mL/min/{1.73_m2} (ref >59)

## 2021-09-26 LAB — TSH: TSH: 0.052 u[IU]/mL — ABNORMAL LOW (ref 0.450–4.500)

## 2021-09-26 NOTE — Patient Instructions (Addendum)
Medication Instructions:  Your physician recommends that you continue on your current medications as directed. Please refer to the Current Medication list given to you today.  *If you need a refill on your cardiac medications before your next appointment, please call your pharmacy*   Lab Work: TODAY: LIPIDS, CMP, TSH  If you have labs (blood work) drawn today and your tests are completely normal, you will receive your results only by: MyChart Message (if you have MyChart) OR A paper copy in the mail If you have any lab test that is abnormal or we need to change your treatment, we will call you to review the results.   Testing/Procedures: Your physician has requested that you have a carotid duplex. This test is an ultrasound of the carotid arteries in your neck. It looks at blood flow through these arteries that supply the brain with blood. Allow one hour for this exam. There are no restrictions or special instructions.    Follow-Up: At [facility], you and your health needs are our priority.  As part of our continuing mission to provide you with exceptional heart care, we have created designated Provider Care Teams.  These Care Teams include your primary Cardiologist (physician) and Advanced Practice Providers (APPs -  Physician Assistants and Nurse Practitioners) who all work together to provide you with the care you need, when you need it.  We recommend signing up for the patient portal called "MyChart".  Sign up information is provided on this After Visit Summary.  MyChart is used to connect with patients for Virtual Visits (Telemedicine).  Patients are able to view lab/test results, encounter notes, upcoming appointments, etc.  Non-urgent messages can be sent to your provider as well.   To learn more about what you can do with MyChart, go to [email or web address].    Your next appointment:   6 month(s)  The format for your next appointment:   In Person  Provider:   Laurance Flatten, MD

## 2021-09-28 ENCOUNTER — Telehealth: Payer: Self-pay | Admitting: Cardiology

## 2021-09-28 DIAGNOSIS — E785 Hyperlipidemia, unspecified: Secondary | ICD-10-CM

## 2021-09-28 NOTE — Telephone Encounter (Signed)
Please let pt know her thyroid is abnormal and she needs to see PCP, she needs to see her PCP as well for treatment. Please send copy to her PCP.  Thanks. Vernona Rieger

## 2021-09-29 NOTE — Telephone Encounter (Signed)
Lm to call back ./cy 

## 2021-09-29 NOTE — Telephone Encounter (Signed)
Pt aware of lab results and copy sent to Dr Rana Snare .[name]

## 2021-09-29 NOTE — Telephone Encounter (Signed)
Keondra is returning Christine's call.

## 2021-10-01 MED ORDER — ATORVASTATIN CALCIUM 40 MG PO TABS: 40.0000 mg | ORAL_TABLET | Freq: Every day | ORAL | 3 refills | Status: DC

## 2021-10-01 NOTE — Addendum Note (Signed)
Addended by: Theresia Majors on: 10/01/2021 02:04 PM   Modules accepted: Orders

## 2021-10-01 NOTE — Telephone Encounter (Signed)
Patient returning call from [place]. She says she did speak with a nurse about her lab results, but all that was mentioned was her thyriod. She is not sure if anything else needed to be discussed and requests a call back if there is anymore information.

## 2021-10-01 NOTE — Telephone Encounter (Signed)
Leone Brand, NP  09/28/2021 10:13 PM EDT     Would like to increase lipitor to 40 mg to get LDL < 70.  Recheck hepatic and lipids in 2 months.  Other labs were normal.    The patient has been notified of the result and verbalized understanding.  All questions (if any) were answered. Theresia Majors, RN 10/01/2021 2:03 PM  Patient will increase lipitor. Repeat labs have been scheduled.

## 2021-10-10 ENCOUNTER — Other Ambulatory Visit: Payer: Self-pay

## 2021-10-10 ENCOUNTER — Ambulatory Visit (HOSPITAL_COMMUNITY)
Admission: RE | Admit: 2021-10-10 | Discharge: 2021-10-10 | Disposition: A | Discharge status: Home / Self Care | Payer: BC Managed Care – PPO | Source: Ambulatory Visit | Attending: Cardiology | Admitting: Cardiology

## 2021-10-10 DIAGNOSIS — R42 Dizziness and giddiness: Secondary | ICD-10-CM | POA: Diagnosis present

## 2021-10-14 ENCOUNTER — Telehealth: Payer: Self-pay | Admitting: Cardiology

## 2021-10-14 NOTE — Progress Notes (Signed)
Pt has been made aware of normal result and verbalized understanding.  jw

## 2021-10-14 NOTE — Telephone Encounter (Signed)
Follow Up:      Patient is calling to see if her Carotid doppler results are ready from 10-10-21?

## 2021-10-14 NOTE — Telephone Encounter (Signed)
-----   Message from Leone Brand, NP sent at 10/14/2021 10:44 AM EST ----- Good news, no blockages in main blood flow to brain.  Neck pain is prob. Muscular skeletal but not due to lack of blood supply.   Nada Boozer, FNP-C

## 2021-12-04 ENCOUNTER — Other Ambulatory Visit: Payer: BC Managed Care – PPO

## 2021-12-07 DIAGNOSIS — J324 Chronic pansinusitis: Secondary | ICD-10-CM | POA: Diagnosis not present

## 2021-12-07 DIAGNOSIS — R051 Acute cough: Secondary | ICD-10-CM | POA: Diagnosis not present

## 2021-12-07 DIAGNOSIS — R0981 Nasal congestion: Secondary | ICD-10-CM | POA: Diagnosis not present

## 2021-12-23 ENCOUNTER — Other Ambulatory Visit: Payer: Self-pay

## 2021-12-23 DIAGNOSIS — Z7901 Long term (current) use of anticoagulants: Secondary | ICD-10-CM | POA: Diagnosis not present

## 2021-12-23 NOTE — Telephone Encounter (Signed)
Dr. Shari Prows initiated the pts synthroid back in 2021, but in result note she advised that the pt follow up with her PCP for further management of her TSH levels and dosing.  Please refer to PCP.  Thanks!

## 2021-12-23 NOTE — Telephone Encounter (Signed)
[facility] is requesting a refill on levothyroxine 125 mcg tablets. Would Dr. Shari Prows like to refill this medication? Please address

## 2021-12-25 ENCOUNTER — Other Ambulatory Visit: Payer: Self-pay

## 2021-12-25 MED ORDER — LEVOTHYROXINE SODIUM 125 MCG PO TABS: 125.0000 ug | ORAL_TABLET | Freq: Every day | ORAL | 0 refills | Status: DC

## 2022-01-22 DIAGNOSIS — Z7901 Long term (current) use of anticoagulants: Secondary | ICD-10-CM | POA: Diagnosis not present

## 2022-02-09 DIAGNOSIS — Z7901 Long term (current) use of anticoagulants: Secondary | ICD-10-CM | POA: Diagnosis not present

## 2022-02-22 NOTE — Progress Notes (Signed)
?Cardiology Office Note:   ? ?Date:  02/22/2022  ? ?ID:  [name], DOB 11/21/1966, MRN [id number] ? ?PCP:  Candice Camp, MD  ?[facility] Cardiologist:  Meriam Sprague, MD  ?[facility] Electrophysiologist:  None  ? ?Referring MD: Candice Camp, MD  ? ? ?History of Present Illness:   ? ?[name] is a 56 y.o. 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 ? ?Was previously seen by Dr. Delton See in 2018 where the patient was complaining of DOE and chest pressure. Underwent exercise stress test where she achieved 7 METs; no electrocardiographic evidence of ischemia. Also has a history of splenic infarct in setting of SLE found to be lupus anticoagulant positive now on lifelong warfarin. Also with family history of CAD with sister passing away at age 74 with SCD.  ? ?Was seen by me in 11/2020 where we obtained Ca score which was 102. Was restarted on lipitor. ? ?Last saw Nada Boozer on 09/2021 where she was doing better. Had cut back to 1-2 cigarettes per week.Carotid ultrasound with mild disease bilaterally. ? ? ?Past Medical History:  ?Diagnosis Date  ? Thyroid disease   ? ? ?Past Surgical History:  ?Procedure Laterality Date  ? BREAST ENHANCEMENT SURGERY Bilateral   ? CESAREAN SECTION    ? CHOLECYSTECTOMY    ? TOTAL ABDOMINAL HYSTERECTOMY    ? TUBAL LIGATION    ? ? ?Current Medications: ?No outpatient medications have been marked as taking for the 02/25/22 encounter (Appointment) with Meriam Sprague, MD.  ?  ? ?Allergies:   Patient has no allergy information on record.  ? ?Social History  ? ?Socioeconomic History  ? Marital status: Married  ?  Spouse name: Not on file  ? Number of children: Not on file  ? Years of education: Not on file  ? Highest education level: Not on file  ?Occupational History  ? Not on file  ?Tobacco Use  ? Smoking status: Former  ? Smokeless tobacco: Never  ?Vaping Use  ? Vaping Use: Never used  ?Substance and  Sexual Activity  ? Alcohol use: Yes  ?  Comment: occasionally  ? Drug use: No  ? Sexual activity: Not on file  ?Other Topics Concern  ? Not on file  ?Social History Narrative  ? Not on file  ? ?Social Determinants of Health  ? ?Financial Resource Strain: Not on file  ?Food Insecurity: Not on file  ?Transportation Needs: Not on file  ?Physical Activity: Not on file  ?Stress: Not on file  ?Social Connections: Not on file  ?  ? ?Family History: ?The patient's family history includes Cancer in her mother; Heart disease in her father. ? ?ROS:   ?Please see the history of present illness.    ?Review of Systems  ?Constitutional:  Negative for chills, fever and malaise/fatigue.  ?HENT:  Negative for congestion.   ?Eyes:  Negative for blurred vision.  ?Respiratory:  Negative for shortness of breath.   ?Cardiovascular:  Negative for chest pain, palpitations, orthopnea, claudication, leg swelling and PND.  ?Gastrointestinal:  Negative for heartburn, nausea and vomiting.  ?Genitourinary:  Negative for hematuria.  ?Musculoskeletal:  Negative for falls.  ?Neurological:  Negative for dizziness and loss of consciousness.  ?Endo/Heme/Allergies:  Negative for polydipsia.  ?Psychiatric/Behavioral:  Negative for substance abuse.   ? ?EKGs/Labs/Other Studies Reviewed:   ? ?The following studies were reviewed today: ?Coronary Ca Score 11/2020: ?IMPRESSION: ?  Coronary calcium score of 102 in the RCA and LAD. This was 96th ?percentile for age and sex matched control. ?  ?Scattered calcifications in the ascending aorta consistent with ?aortic atherosclerosis. ?  ?Recommend aggressive risk factor modifcation. ? ?Carotid Ultrasound 10/2021: ?Summary:  ?Right Carotid: There is no evidence of stenosis in the right ICA.  ?               Non-hemodynamically significant plaque <50% noted in the  ?CCA. The  ?               extracranial vessels were near-normal with only minimal  ?wall  ?               thickening or plaque.  ? ?Left Carotid:  Velocities in the left ICA are consistent with a 1-39%  ?stenosis.  ?              The ECA appears >50% stenosed.  ? ?Vertebrals:  Bilateral vertebral arteries demonstrate antegrade flow.  ?Subclavians: Right subclavian artery flow was disturbed. Normal flow  ?             hemodynamics were seen in the left subclavian artery.  ?Exercise stress 2018: ?ECG Baseline ECG exhibits normal sinus rhythm.Marland Kitchen  ?Stress Findings The patient exercised following the Bruce protocol.   ?The patient reported no symptoms during the stress test. The patient experienced no angina during the stress test.  ? ?The patient requested the test to be stopped. The test was stopped because  the patient complained of fatigue and shortness of breath.  ? ?Blood pressure and heart rate demonstrated a normal response to exercise. Blood pressure demonstrated a normal response to exercise. Overall, the patient's exercise capacity was mildly impaired.  ? ?85% of maximum heart rate was achieved after 4.2 minutes.  Recovery time:  5 minutes.  The patient's response to exercise was adequate for diagnosis.  ?Response to Stress There was no ST segment deviation noted during stress.  ?Arrhythmias during stress:  none.   ?Arrhythmias during recovery:  none.     ?There were no significant arrhythmias noted during the test.   ?ECG was interpretable.  ? ? ?ECG 2018: NSR; no ischemia, no block ? ?EKG:  EKG is  ordered today.  The ekg ordered today demonstrates NSR with HR 81 ? ?Recent Labs: ?09/26/2021: ALT 26; BUN 11; Creatinine, Ser 0.80; Potassium 4.1; Sodium 143; TSH 0.052  ?Recent Lipid Panel ?   ?Component Value Date/Time  ? CHOL 160 09/26/2021 0842  ? TRIG 133 09/26/2021 0842  ? HDL 46 09/26/2021 0842  ? CHOLHDL 3.5 09/26/2021 0842  ? LDLCALC 90 09/26/2021 0842  ? ? ? ? ?Physical Exam:   ? ?VS:  There were no vitals taken for this visit.   ? ?Wt Readings from Last 3 Encounters:  ?09/26/21 151 lb 9.6 oz (68.8 kg)  ?11/11/20 142 lb 9.6 oz (64.7 kg)  ?11/27/19  140 lb (63.5 kg)  ?  ? ?GEN:  Well nourished, well developed in no acute distress ?HEENT: Normal ?NECK: No JVD; No carotid bruits ?CARDIAC: RRR, no murmurs, rubs, gallops ?RESPIRATORY:  Clear to auscultation without rales, wheezing or rhonchi  ?ABDOMEN: Soft, non-tender, non-distended ?MUSCULOSKELETAL:  No edema; No deformity  ?SKIN: Warm and dry ?NEUROLOGIC:  Alert and oriented x 3 ?PSYCHIATRIC:  Normal affect  ? ?ASSESSMENT:   ? ?No diagnosis found. ? ?PLAN:   ? ?In order of problems listed above: ? ?#Coronary Calcification: ?#Family history  of premature CAD: ?#HLD: ?Ca score 102 which is the 96% for age and sex matched controls. Now on aggressive risk factor modification. ?-Continue lipitor 40mg  daily ?-Continue to work on tobacco cessation ?-Lifestyle modifications as detailed below ? ?#SLE with history of splenic infarct. ?Managed by hematology now on lifelong [facility] with warfarin. No further arterial or venous thrombosis since starting AC. ?-Continue lifelong warfarin ?-Management per Hematology ? ?#Tobacco abuse: ?Cutting back. ?-Counseled regarding the importance of smoking cessation ? ?#Hypothyroidism: ?-Continue synthroid ? ?Exercise recommendations: ?Goal of exercising for at least 30 minutes a day, at least 5 times per week.  Please exercise to a moderate exertion.  This means that while exercising it is difficult to speak in full sentences, however you are not so short of breath that you feel you must stop, and not so comfortable that you can carry on a full conversation.  Exertion level should be approximately a 5/10, if 10 is the most exertion you can perform. ? ?Diet recommendations: ?Recommend a heart healthy diet such as the Mediterranean diet.  This diet consists of plant based foods, healthy fats, lean meats, olive oil.  It suggests limiting the intake of simple carbohydrates such as white breads, pastries, and pastas.  It also limits the amount of red meat, wine, and dairy products such as cheese  that one should consume on a daily basis.  ? ? ?Medication Adjustments/Labs and Tests Ordered: ?Current medicines are reviewed at length with the patient today.  Concerns regarding medicines are outlined above.  ?

## 2022-02-23 ENCOUNTER — Other Ambulatory Visit: Payer: BC Managed Care – PPO

## 2022-02-24 NOTE — Progress Notes (Signed)
?Cardiology Office Note:   ? ?Date:  02/25/2022  ? ?ID:  [name], DOB [birth year], MRN [id number] ? ?PCP:  Candice Camp, MD  ?[facility] Cardiologist:  Meriam Sprague, MD  ?[facility] Electrophysiologist:  None  ? ?Referring MD: Candice Camp, MD  ? ? ?History of Present Illness:   ? ?[name] is a 56 y.o. 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 ? ?Was previously seen by Dr. Delton See in 2018 where the patient was complaining of DOE and chest pressure. Underwent exercise stress test where she achieved 7 METs; no electrocardiographic evidence of ischemia. Also has a history of splenic infarct in setting of SLE found to be lupus anticoagulant positive now on lifelong warfarin. Also with family history of CAD with sister passing away at age 56 with SCD.  ? ?Was seen by me in 11/2020 where we obtained Ca score which was 102. Was restarted on lipitor. ? ?Last saw Nada Boozer on 09/2021 where she was doing better. Had cut back to 1-2 cigarettes per week. Carotid ultrasound with mild disease bilaterally. ? ?Today, she is doing well. She has quit smoking. She endorses occasional chest pain which she associates to anxiety. The pain can occur while she is resting with no exertional symptoms. She denies chest pressure, palpitations, PND, orthopnea, or leg swelling.  ? ?She also endorses occasional shortness of breath which she associates to her history of smoking for 25 years. She walks for 30 minutes daily. She denies syncope or lightheadedness.  ? ?She is tolerating Lipitor. She denies bleeding on warfarin.  ? ?Past Medical History:  ?Diagnosis Date  ? Thyroid disease   ? ? ?Past Surgical History:  ?Procedure Laterality Date  ? BREAST ENHANCEMENT SURGERY Bilateral   ? CESAREAN SECTION    ? CHOLECYSTECTOMY    ? TOTAL ABDOMINAL HYSTERECTOMY    ? TUBAL LIGATION    ? ? ?Current Medications: ?Current Meds  ?Medication Sig  ? atorvastatin  (LIPITOR) 40 MG tablet Take 1 tablet (40 mg total) by mouth daily.  ? hydroxychloroquine (PLAQUENIL) 200 MG tablet Take 400 mg by mouth daily.  ? levothyroxine (SYNTHROID) 125 MCG tablet Take 1 tablet (125 mcg total) by mouth daily before breakfast. Pt needs to keep upcoming appt in Mar for further refills  ? warfarin (COUMADIN) 7.5 MG tablet Take 7.5 mg by mouth daily.  ?  ? ?Allergies:   Patient has no allergy information on record.  ? ?Social History  ? ?Socioeconomic History  ? Marital status: Married  ?  Spouse name: Not on file  ? Number of children: Not on file  ? Years of education: Not on file  ? Highest education level: Not on file  ?Occupational History  ? Not on file  ?Tobacco Use  ? Smoking status: Former  ? Smokeless tobacco: Never  ?Vaping Use  ? Vaping Use: Never used  ?Substance and Sexual Activity  ? Alcohol use: Yes  ?  Comment: occasionally  ? Drug use: No  ? Sexual activity: Not on file  ?Other Topics Concern  ? Not on file  ?Social History Narrative  ? Not on file  ? ?Social Determinants of Health  ? ?Financial Resource Strain: Not on file  ?Food Insecurity: Not on file  ?Transportation Needs: Not on file  ?Physical Activity: Not on file  ?Stress: Not on file  ?Social Connections: Not on file  ?  ? ?  Family History: ?The patient's family history includes Cancer in her mother; Heart disease in her father. ? ?ROS:   ?Please see the history of present illness.    ?Review of Systems  ?Constitutional:  Negative for chills, fever and malaise/fatigue.  ?HENT:  Negative for congestion.   ?Eyes:  Negative for blurred vision.  ?Respiratory:  Positive for shortness of breath.   ?Cardiovascular:  Positive for chest pain. Negative for palpitations, orthopnea, claudication, leg swelling and PND.  ?Gastrointestinal:  Negative for heartburn, nausea and vomiting.  ?Genitourinary:  Negative for hematuria.  ?Musculoskeletal:  Negative for falls.  ?Skin:  Negative for rash.  ?Neurological:  Negative for dizziness  and loss of consciousness.  ?Endo/Heme/Allergies:  Negative for polydipsia.  ?Psychiatric/Behavioral:  The patient is nervous/anxious.   ? ?EKGs/Labs/Other Studies Reviewed:   ? ?The following studies were reviewed today: ?Carotid Duplex 10/10/21 ?Summary:  ?Right Carotid: There is no evidence of stenosis in the right ICA. Non-hemodynamically significant plaque <50% noted in the  CCA. The  extracranial vessels were near-normal with only minimal wall thickening or plaque.  ? ?Left Carotid: Velocities in the left ICA are consistent with a 1-39% stenosis. The ECA appears >50% stenosed.  ? ?Vertebrals:  Bilateral vertebral arteries demonstrate antegrade flow.  ? ?Subclavians: Right subclavian artery flow was disturbed. Normal flow hemodynamics were seen in the left subclavian artery.  ? ?Coronary Ca Score 11/2020: ?IMPRESSION: ?Coronary calcium score of 102 in the RCA and LAD. This was 96th percentile for age and sex matched control. ?  ?Scattered calcifications in the ascending aorta consistent with aortic atherosclerosis. ?  ?Recommend aggressive risk factor modifcation. ? ?Carotid Ultrasound 10/2021: ?Summary:  ?Right Carotid: There is no evidence of stenosis in the right ICA. Non-hemodynamically significant plaque <50% noted in the CCA. The extracranial vessels were near-normal with only minimal wall thickening or plaque.  ? ?Left Carotid: Velocities in the left ICA are consistent with a 1-39% stenosis. The ECA appears >50% stenosed.  ? ?Vertebrals:  Bilateral vertebral arteries demonstrate antegrade flow.  ? ?Subclavians: Right subclavian artery flow was disturbed. Normal flow hemodynamics were seen in the left subclavian artery.  ? ?Exercise stress 2018: ?ECG Baseline ECG exhibits normal sinus rhythm.Marland Kitchen  ?Stress Findings The patient exercised following the Bruce protocol.   ?The patient reported no symptoms during the stress test. The patient experienced no angina during the stress test.  ? ?The patient requested  the test to be stopped. The test was stopped because  the patient complained of fatigue and shortness of breath.  ? ?Blood pressure and heart rate demonstrated a normal response to exercise. Blood pressure demonstrated a normal response to exercise. Overall, the patient's exercise capacity was mildly impaired.  ? ?85% of maximum heart rate was achieved after 4.2 minutes.  Recovery time:  5 minutes.  The patient's response to exercise was adequate for diagnosis.  ?Response to Stress There was no ST segment deviation noted during stress.  ?Arrhythmias during stress:  none.   ?Arrhythmias during recovery:  none.     ?There were no significant arrhythmias noted during the test.   ?ECG was interpretable.  ? ? ? ? ?EKG:   ?02/25/22: NSR, HR 88 bpm ?01/13/20: NSR with HR 81 ?ECG 2018: NSR; no ischemia, no block ? ?Recent Labs: ?09/26/2021: ALT 26; BUN 11; Creatinine, Ser 0.80; Potassium 4.1; Sodium 143; TSH 0.052  ?Recent Lipid Panel ?   ?Component Value Date/Time  ? CHOL 160 09/26/2021 0842  ? TRIG 133 09/26/2021  1610  ? HDL 46 09/26/2021 0842  ? CHOLHDL 3.5 09/26/2021 0842  ? LDLCALC 90 09/26/2021 0842  ? ? ? ? ?Physical Exam:   ? ?VS:  BP 118/72   Pulse 88   Ht 5' (1.524 m)   Wt 154 lb 9.6 oz (70.1 kg)   SpO2 96%   BMI 30.19 kg/m?    ? ?Wt Readings from Last 3 Encounters:  ?02/25/22 154 lb 9.6 oz (70.1 kg)  ?09/26/21 151 lb 9.6 oz (68.8 kg)  ?11/11/20 142 lb 9.6 oz (64.7 kg)  ?  ? ?GEN:  Well nourished, well developed in no acute distress ?HEENT: Normal ?NECK: No JVD; No carotid bruits ?CARDIAC: RRR, no murmurs, rubs, gallops ?RESPIRATORY:  Clear to auscultation without rales, wheezing or rhonchi  ?ABDOMEN: Soft, non-tender, non-distended ?MUSCULOSKELETAL:  No edema; No deformity  ?SKIN: Warm and dry ?NEUROLOGIC:  Alert and oriented x 3 ?PSYCHIATRIC:  Normal affect  ? ?ASSESSMENT:   ? ?1. Coronary artery disease involving native coronary artery of native heart without angina pectoris   ?2. Hyperlipidemia, unspecified  hyperlipidemia type   ?3. Family history of early CAD   ?4. SOB (shortness of breath)   ?5. Hypothyroidism, unspecified type   ? ? ?PLAN:   ? ?In order of problems listed above: ? ?#Coronary Calcification: ?#Family h

## 2022-02-25 ENCOUNTER — Encounter: Payer: Self-pay | Admitting: *Deleted

## 2022-02-25 ENCOUNTER — Other Ambulatory Visit: Payer: Self-pay

## 2022-02-25 ENCOUNTER — Encounter: Payer: Self-pay | Admitting: Cardiology

## 2022-02-25 ENCOUNTER — Ambulatory Visit (INDEPENDENT_AMBULATORY_CARE_PROVIDER_SITE_OTHER): Payer: BC Managed Care – PPO | Admitting: Cardiology

## 2022-02-25 VITALS — BP 118/72 | HR 88 | Ht 60.0 in | Wt 154.6 lb

## 2022-02-25 DIAGNOSIS — E039 Hypothyroidism, unspecified: Secondary | ICD-10-CM

## 2022-02-25 DIAGNOSIS — Z8249 Family history of ischemic heart disease and other diseases of the circulatory system: Secondary | ICD-10-CM | POA: Diagnosis not present

## 2022-02-25 DIAGNOSIS — I2510 Atherosclerotic heart disease of native coronary artery without angina pectoris: Secondary | ICD-10-CM | POA: Diagnosis not present

## 2022-02-25 DIAGNOSIS — R0602 Shortness of breath: Secondary | ICD-10-CM | POA: Diagnosis not present

## 2022-02-25 DIAGNOSIS — E785 Hyperlipidemia, unspecified: Secondary | ICD-10-CM | POA: Diagnosis not present

## 2022-02-25 NOTE — Patient Instructions (Signed)
Medication Instructions:  ? ?Your physician recommends that you continue on your current medications as directed. Please refer to the Current Medication list given to you today. ? ?*If you need a refill on your cardiac medications before your next appointment, please call your pharmacy* ? ? ?Lab Work: ? ?SAME DAY WHEN YOUR ECHO IS [facility] LIPIDS--COME FASTING TO THIS LAB APPOINTMENT ? ?If you have labs (blood work) drawn today and your tests are completely normal, you will receive your results only by: ?MyChart Message (if you have MyChart) OR ?A paper copy in the mail ?If you have any lab test that is abnormal or we need to change your treatment, we will call you to review the results. ? ? ?Testing/Procedures: ? ?Your physician has requested that you have an echocardiogram. Echocardiography is a painless test that uses sound waves to create images of your heart. It provides your doctor with information about the size and shape of your heart and how well your heart?s chambers and valves are working. This procedure takes approximately one hour. There are no restrictions for this procedure.  SCHEDULE LIPIDS AT THAT TIME ? ? ?Follow-Up: ?At [facility], you and your health needs are our priority.  As part of our continuing mission to provide you with exceptional heart care, we have created designated Provider Care Teams.  These Care Teams include your primary Cardiologist (physician) and Advanced Practice Providers (APPs -  Physician Assistants and Nurse Practitioners) who all work together to provide you with the care you need, when you need it. ? ?We recommend signing up for the patient portal called "MyChart".  Sign up information is provided on this After Visit Summary.  MyChart is used to connect with patients for Virtual Visits (Telemedicine).  Patients are able to view lab/test results, encounter notes, upcoming appointments, etc.  Non-urgent messages can be sent to your provider as well.   ?To learn  more about what you can do with MyChart, go to [email or web address].   ? ?Your next appointment:   ?6 month(s) ? ?The format for your next appointment:   ?In Person ? ?Provider:   ?Freada Bergeron, MD { ? ? ?

## 2022-03-11 ENCOUNTER — Ambulatory Visit (HOSPITAL_COMMUNITY): Payer: BC Managed Care – PPO | Attending: Cardiology

## 2022-03-11 ENCOUNTER — Other Ambulatory Visit: Payer: BC Managed Care – PPO | Admitting: *Deleted

## 2022-03-11 DIAGNOSIS — E785 Hyperlipidemia, unspecified: Secondary | ICD-10-CM | POA: Diagnosis not present

## 2022-03-11 DIAGNOSIS — Z8249 Family history of ischemic heart disease and other diseases of the circulatory system: Secondary | ICD-10-CM

## 2022-03-11 DIAGNOSIS — R0602 Shortness of breath: Secondary | ICD-10-CM

## 2022-03-11 LAB — ECHOCARDIOGRAM COMPLETE
Area-P 1/2: 3.48 cm2
S' Lateral: 2.4 cm

## 2022-03-11 LAB — LIPID PANEL
Chol/HDL Ratio: 4.3 ratio (ref 0.0–4.4)
Cholesterol, Total: 160 mg/dL (ref 100–199)
HDL: 37 mg/dL — ABNORMAL LOW (ref >39)
LDL Chol Calc (NIH): 91 mg/dL (ref 0–99)
Triglycerides: 184 mg/dL — ABNORMAL HIGH (ref 0–149)
VLDL Cholesterol Cal: 32 mg/dL (ref 5–40)

## 2022-03-13 ENCOUNTER — Telehealth: Payer: Self-pay | Admitting: *Deleted

## 2022-03-13 DIAGNOSIS — E7800 Pure hypercholesterolemia, unspecified: Secondary | ICD-10-CM

## 2022-03-13 DIAGNOSIS — I2510 Atherosclerotic heart disease of native coronary artery without angina pectoris: Secondary | ICD-10-CM

## 2022-03-13 DIAGNOSIS — Z79899 Other long term (current) drug therapy: Secondary | ICD-10-CM

## 2022-03-13 DIAGNOSIS — E785 Hyperlipidemia, unspecified: Secondary | ICD-10-CM

## 2022-03-13 DIAGNOSIS — Z8249 Family history of ischemic heart disease and other diseases of the circulatory system: Secondary | ICD-10-CM

## 2022-03-13 MED ORDER — EZETIMIBE 10 MG PO TABS: 10.0000 mg | ORAL_TABLET | Freq: Every day | ORAL | 3 refills | Status: AC

## 2022-03-13 NOTE — Telephone Encounter (Signed)
The patient has been notified of the result and verbalized understanding.  All questions (if any) were answered. Loa Socks, LPN 04/12/8468 6:29 AM  ? ? ?Pt aware to start taking zetia 10 mg po daily. ?She will come in for repeat lipids in 6-8 weeks on 04/30/22.  She is aware to come fasting.  ?Confirmed the pharmacy of choice with the pt.  ?Pt verbalized understanding and agrees with this plan. ?

## 2022-03-13 NOTE — Telephone Encounter (Signed)
-----   Message from Meriam Sprague, MD sent at 03/12/2022  7:11 PM EDT ----- ?Her LDL is above goal at 91 (goal LDL<70). Can we add zetia 10mg  daily and repeat lipids in 6-8 weeks? ?

## 2022-03-18 DIAGNOSIS — N3001 Acute cystitis with hematuria: Secondary | ICD-10-CM | POA: Diagnosis not present

## 2022-03-18 DIAGNOSIS — Z7901 Long term (current) use of anticoagulants: Secondary | ICD-10-CM | POA: Diagnosis not present

## 2022-03-30 ENCOUNTER — Other Ambulatory Visit: Payer: Self-pay | Admitting: *Deleted

## 2022-04-01 ENCOUNTER — Other Ambulatory Visit: Payer: Self-pay

## 2022-04-01 MED ORDER — LEVOTHYROXINE SODIUM 125 MCG PO TABS: 125.0000 ug | ORAL_TABLET | Freq: Every day | ORAL | 1 refills | Status: DC

## 2022-04-02 ENCOUNTER — Other Ambulatory Visit: Payer: Self-pay | Admitting: Cardiology

## 2022-04-23 DIAGNOSIS — Z7901 Long term (current) use of anticoagulants: Secondary | ICD-10-CM | POA: Diagnosis not present

## 2022-04-30 ENCOUNTER — Other Ambulatory Visit: Payer: BC Managed Care – PPO

## 2022-05-07 DIAGNOSIS — J019 Acute sinusitis, unspecified: Secondary | ICD-10-CM | POA: Diagnosis not present

## 2022-05-11 ENCOUNTER — Other Ambulatory Visit: Payer: BC Managed Care – PPO

## 2022-05-20 DIAGNOSIS — Z7901 Long term (current) use of anticoagulants: Secondary | ICD-10-CM | POA: Diagnosis not present

## 2022-06-17 DIAGNOSIS — Z7901 Long term (current) use of anticoagulants: Secondary | ICD-10-CM | POA: Diagnosis not present

## 2022-06-22 DIAGNOSIS — Z7901 Long term (current) use of anticoagulants: Secondary | ICD-10-CM | POA: Diagnosis not present

## 2022-07-06 DIAGNOSIS — Z7901 Long term (current) use of anticoagulants: Secondary | ICD-10-CM | POA: Diagnosis not present

## 2022-07-23 DIAGNOSIS — Z7901 Long term (current) use of anticoagulants: Secondary | ICD-10-CM | POA: Diagnosis not present

## 2022-07-30 DIAGNOSIS — M3500 Sicca syndrome, unspecified: Secondary | ICD-10-CM | POA: Diagnosis not present

## 2022-07-30 DIAGNOSIS — M7061 Trochanteric bursitis, right hip: Secondary | ICD-10-CM | POA: Diagnosis not present

## 2022-07-30 DIAGNOSIS — M79644 Pain in right finger(s): Secondary | ICD-10-CM | POA: Diagnosis not present

## 2022-07-30 DIAGNOSIS — M329 Systemic lupus erythematosus, unspecified: Secondary | ICD-10-CM | POA: Diagnosis not present

## 2022-08-25 DIAGNOSIS — Z7901 Long term (current) use of anticoagulants: Secondary | ICD-10-CM | POA: Diagnosis not present

## 2022-08-30 NOTE — Progress Notes (Deleted)
Cardiology Office Note:    Date:  08/30/2022   ID:  [name], DOB 03-Apr-1966, MRN [id number]  PCP:  Candice Camp, MD  [facility] Cardiologist:  Meriam Sprague, MD  [facility] Electrophysiologist:  None   Referring MD: Candice Camp, MD    History of Present Illness:    [name] is a 56 y.o. 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  Was previously seen by Dr. Delton See in 2018 where the patient was complaining of DOE and chest pressure. Underwent exercise stress test where she achieved 7 METs; no electrocardiographic evidence of ischemia. Also has a history of splenic infarct in setting of SLE found to be lupus anticoagulant positive now on lifelong warfarin. Also with family history of CAD with sister passing away at age 7 with SCD.   Was seen by me in 11/2020 where we obtained Ca score which was 102. Was restarted on lipitor.  Saw Nada Boozer on 09/2021 where she was doing better. Had cut back to 1-2 cigarettes per week. Carotid ultrasound with mild disease bilaterally.  Was last seen in clinic on 02/2022 where she was doing well from CV standpoint. Had successfully quit smoking! TTE was obtained due to mild SOB which showed LVEF 60-65%, normal GLS, normal RV, trivial MR.   Today, ***  Past Medical History:  Diagnosis Date   Thyroid disease     Past Surgical History:  Procedure Laterality Date   BREAST ENHANCEMENT SURGERY Bilateral    CESAREAN SECTION     CHOLECYSTECTOMY     TOTAL ABDOMINAL HYSTERECTOMY     TUBAL LIGATION      Current Medications: No outpatient medications have been marked as taking for the 09/03/22 encounter (Appointment) with Meriam Sprague, MD.     Allergies:   Patient has no allergy information on record.   Social History   Socioeconomic History   Marital status: Married    Spouse name: Not on file   Number of children: Not on file   Years of education:  Not on file   Highest education level: Not on file  Occupational History   Not on file  Tobacco Use   Smoking status: Former   Smokeless tobacco: Never  Vaping Use   Vaping Use: Never used  Substance and Sexual Activity   Alcohol use: Yes    Comment: occasionally   Drug use: No   Sexual activity: Not on file  Other Topics Concern   Not on file  Social History Narrative   Not on file   Social Determinants of Health   Financial Resource Strain: Not on file  Food Insecurity: Not on file  Transportation Needs: Not on file  Physical Activity: Not on file  Stress: Not on file  Social Connections: Not on file     Family History: The patient's family history includes Cancer in her mother; Heart disease in her father.  ROS:   Please see the history of present illness.    Review of Systems  Constitutional:  Negative for chills, fever and malaise/fatigue.  HENT:  Negative for congestion.   Eyes:  Negative for blurred vision.  Respiratory:  Positive for shortness of breath.   Cardiovascular:  Positive for chest pain. Negative for palpitations, orthopnea, claudication, leg swelling and PND.  Gastrointestinal:  Negative for heartburn, nausea and vomiting.  Genitourinary:  Negative for hematuria.  Musculoskeletal:  Negative for falls.  Skin:  Negative for rash.  Neurological:  Negative for dizziness and loss of consciousness.  Endo/Heme/Allergies:  Negative for polydipsia.  Psychiatric/Behavioral:  The patient is nervous/anxious.     EKGs/Labs/Other Studies Reviewed:    The following studies were reviewed today: TTE 03-31-22: IMPRESSIONS     1. Left ventricular ejection fraction, by estimation, is 60 to 65%. The  left ventricle has normal function. The left ventricle has no regional  wall motion abnormalities. Left ventricular diastolic parameters were  normal. The average left ventricular  global longitudinal strain is -23.4 %. The global longitudinal strain is  normal.    2. Right ventricular systolic function is normal. The right ventricular  size is normal.   3. The mitral valve is abnormal. Trivial mitral valve regurgitation. No  evidence of mitral stenosis.   4. The aortic valve is tricuspid. There is mild calcification of the  aortic valve. Aortic valve regurgitation is not visualized. Aortic valve  sclerosis is present, with no evidence of aortic valve stenosis.   5. The inferior vena cava is normal in size with greater than 50%  respiratory variability, suggesting right atrial pressure of 3 mmHg.  Carotid Duplex 10/10/21 Summary:  Right Carotid: There is no evidence of stenosis in the right ICA. Non-hemodynamically significant plaque <50% noted in the  CCA. The  extracranial vessels were near-normal with only minimal wall thickening or plaque.   Left Carotid: Velocities in the left ICA are consistent with a 1-39% stenosis. The ECA appears >50% stenosed.   Vertebrals:  Bilateral vertebral arteries demonstrate antegrade flow.   Subclavians: Right subclavian artery flow was disturbed. Normal flow hemodynamics were seen in the left subclavian artery.   Coronary Ca Score 11/2020: IMPRESSION: Coronary calcium score of 102 in the RCA and LAD. This was 96th percentile for age and sex matched control.   Scattered calcifications in the ascending aorta consistent with aortic atherosclerosis.   Recommend aggressive risk factor modifcation.  Carotid Ultrasound 10/2021: Summary:  Right Carotid: There is no evidence of stenosis in the right ICA. Non-hemodynamically significant plaque <50% noted in the CCA. The extracranial vessels were near-normal with only minimal wall thickening or plaque.   Left Carotid: Velocities in the left ICA are consistent with a 1-39% stenosis. The ECA appears >50% stenosed.   Vertebrals:  Bilateral vertebral arteries demonstrate antegrade flow.   Subclavians: Right subclavian artery flow was disturbed. Normal flow hemodynamics  were seen in the left subclavian artery.   Exercise stress 2018: ECG Baseline ECG exhibits normal sinus rhythm..  Stress Findings The patient exercised following the Bruce protocol.   The patient reported no symptoms during the stress test. The patient experienced no angina during the stress test.   The patient requested the test to be stopped. The test was stopped because  the patient complained of fatigue and shortness of breath.   Blood pressure and heart rate demonstrated a normal response to exercise. Blood pressure demonstrated a normal response to exercise. Overall, the patient's exercise capacity was mildly impaired.   85% of maximum heart rate was achieved after 4.2 minutes.  Recovery time:  5 minutes.  The patient's response to exercise was adequate for diagnosis.  Response to Stress There was no ST segment deviation noted during stress.  Arrhythmias during stress:  none.   Arrhythmias during recovery:  none.     There were no significant arrhythmias noted during the test.   ECG was interpretable.      EKG:   02/25/22:  NSR, HR 88 bpm 01/13/20: NSR with HR 81 ECG 2018: NSR; no ischemia, no block  Recent Labs: 09/26/2021: ALT 26; BUN 11; Creatinine, Ser 0.80; Potassium 4.1; Sodium 143; TSH 0.052  Recent Lipid Panel    Component Value Date/Time   CHOL 160 03/11/2022 0757   TRIG 184 (H) 03/11/2022 0757   HDL 37 (L) 03/11/2022 0757   CHOLHDL 4.3 03/11/2022 0757   LDLCALC 91 03/11/2022 0757      Physical Exam:    VS:  There were no vitals taken for this visit.    Wt Readings from Last 3 Encounters:  02/25/22 154 lb 9.6 oz (70.1 kg)  09/26/21 151 lb 9.6 oz (68.8 kg)  11/11/20 142 lb 9.6 oz (64.7 kg)     GEN:  Well nourished, well developed in no acute distress HEENT: Normal NECK: No JVD; No carotid bruits CARDIAC: RRR, no murmurs, rubs, gallops RESPIRATORY:  Clear to auscultation without rales, wheezing or rhonchi  ABDOMEN: Soft, non-tender,  non-distended MUSCULOSKELETAL:  No edema; No deformity  SKIN: Warm and dry NEUROLOGIC:  Alert and oriented x 3 PSYCHIATRIC:  Normal affect   ASSESSMENT:    No diagnosis found.   PLAN:    In order of problems listed above:  #Coronary Calcification: #Family history of premature CAD: #HLD: Ca score 102 which is the 96% for age and sex matched controls. Now on aggressive risk factor modification. -Continue lipitor 40mg  daily -Has successfully quit smoking -Lifestyle modifications as detailed below  #SLE with history of splenic infarct. Managed by hematology now on lifelong [facility] with warfarin. No further arterial or venous thrombosis since starting AC. -Continue lifelong warfarin -Management per Hematology  #DOE: Likely secondary to prolonged smoking history. TTE with LVEF 60-65%, normal RV, no significant valve disease.   #History of Tobacco abuse: Successfully quit  #Hypothyroidism: -Continue synthroid  Exercise recommendations: Goal of exercising for at least 30 minutes a day, at least 5 times per week.  Please exercise to a moderate exertion.  This means that while exercising it is difficult to speak in full sentences, however you are not so short of breath that you feel you must stop, and not so comfortable that you can carry on a full conversation.  Exertion level should be approximately a 5/10, if 10 is the most exertion you can perform.  Diet recommendations: Recommend a heart healthy diet such as the Mediterranean diet.  This diet consists of plant based foods, healthy fats, lean meats, olive oil.  It suggests limiting the intake of simple carbohydrates such as white breads, pastries, and pastas.  It also limits the amount of red meat, wine, and dairy products such as cheese that one should consume on a daily basis.    Medication Adjustments/Labs and Tests Ordered: Current medicines are reviewed at length with the patient today.  Concerns regarding medicines are  outlined above.  No orders of the defined types were placed in this encounter.  No orders of the defined types were placed in this encounter.   There are no Patient Instructions on file for this visit.   Signed, Meriam Sprague, MD  08/30/2022 7:53 PM    [facility]

## 2022-09-03 ENCOUNTER — Ambulatory Visit: Payer: BC Managed Care – PPO | Admitting: Cardiology

## 2022-09-08 DIAGNOSIS — Z7901 Long term (current) use of anticoagulants: Secondary | ICD-10-CM | POA: Diagnosis not present

## 2022-09-23 DIAGNOSIS — Z7901 Long term (current) use of anticoagulants: Secondary | ICD-10-CM | POA: Diagnosis not present

## 2022-09-29 NOTE — Progress Notes (Deleted)
Cardiology Office Note:    Date:  09/29/2022   ID:  [name], DOB 04-Jul-1966, MRN [id number]  PCP:  Candice Camp, MD  [facility] Cardiologist:  Meriam Sprague, MD  [facility] Electrophysiologist:  None   Referring MD: Candice Camp, MD    History of Present Illness:    [name] is a 56 y.o. 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  Was previously seen by Dr. Delton See in 2018 where the patient was complaining of DOE and chest pressure. Underwent exercise stress test where she achieved 7 METs; no electrocardiographic evidence of ischemia. Also has a history of splenic infarct in setting of SLE found to be lupus anticoagulant positive now on lifelong warfarin. Also with family history of CAD with sister passing away at age 50 with SCD.   Was seen by me in 11/2020 where we obtained Ca score which was 102. Was restarted on lipitor.  Saw Nada Boozer on 09/2021 where she was doing better. Had cut back to 1-2 cigarettes per week. Carotid ultrasound with mild disease bilaterally.  Was last seen in clinic on 02/2022 where she was doing well from CV standpoint. Had successfully quit smoking! TTE was obtained due to mild SOB which showed LVEF 60-65%, normal GLS, normal RV, trivial MR.   Today, ***  Past Medical History:  Diagnosis Date   Thyroid disease     Past Surgical History:  Procedure Laterality Date   BREAST ENHANCEMENT SURGERY Bilateral    CESAREAN SECTION     CHOLECYSTECTOMY     TOTAL ABDOMINAL HYSTERECTOMY     TUBAL LIGATION      Current Medications: No outpatient medications have been marked as taking for the 10/05/22 encounter (Appointment) with Meriam Sprague, MD.     Allergies:   Patient has no allergy information on record.   Social History   Socioeconomic History   Marital status: Married    Spouse name: Not on file   Number of children: Not on file   Years of  education: Not on file   Highest education level: Not on file  Occupational History   Not on file  Tobacco Use   Smoking status: Former   Smokeless tobacco: Never  Vaping Use   Vaping Use: Never used  Substance and Sexual Activity   Alcohol use: Yes    Comment: occasionally   Drug use: No   Sexual activity: Not on file  Other Topics Concern   Not on file  Social History Narrative   Not on file   Social Determinants of Health   Financial Resource Strain: Not on file  Food Insecurity: Not on file  Transportation Needs: Not on file  Physical Activity: Not on file  Stress: Not on file  Social Connections: Not on file     Family History: The patient's family history includes Cancer in her mother; Heart disease in her father.  ROS:   Please see the history of present illness.    Review of Systems  Constitutional:  Negative for chills, fever and malaise/fatigue.  HENT:  Negative for congestion.   Eyes:  Negative for blurred vision.  Respiratory:  Positive for shortness of breath.   Cardiovascular:  Positive for chest pain. Negative for palpitations, orthopnea, claudication, leg swelling and PND.  Gastrointestinal:  Negative for heartburn, nausea and vomiting.  Genitourinary:  Negative for hematuria.  Musculoskeletal:  Negative for falls.  Skin:  Negative for rash.  Neurological:  Negative for dizziness and loss of consciousness.  Endo/Heme/Allergies:  Negative for polydipsia.  Psychiatric/Behavioral:  The patient is nervous/anxious.     EKGs/Labs/Other Studies Reviewed:    The following studies were reviewed today: TTE 04/21/22: IMPRESSIONS     1. Left ventricular ejection fraction, by estimation, is 60 to 65%. The  left ventricle has normal function. The left ventricle has no regional  wall motion abnormalities. Left ventricular diastolic parameters were  normal. The average left ventricular  global longitudinal strain is -23.4 %. The global longitudinal strain is   normal.   2. Right ventricular systolic function is normal. The right ventricular  size is normal.   3. The mitral valve is abnormal. Trivial mitral valve regurgitation. No  evidence of mitral stenosis.   4. The aortic valve is tricuspid. There is mild calcification of the  aortic valve. Aortic valve regurgitation is not visualized. Aortic valve  sclerosis is present, with no evidence of aortic valve stenosis.   5. The inferior vena cava is normal in size with greater than 50%  respiratory variability, suggesting right atrial pressure of 3 mmHg.  Carotid Duplex 10/10/21 Summary:  Right Carotid: There is no evidence of stenosis in the right ICA. Non-hemodynamically significant plaque <50% noted in the  CCA. The  extracranial vessels were near-normal with only minimal wall thickening or plaque.   Left Carotid: Velocities in the left ICA are consistent with a 1-39% stenosis. The ECA appears >50% stenosed.   Vertebrals:  Bilateral vertebral arteries demonstrate antegrade flow.   Subclavians: Right subclavian artery flow was disturbed. Normal flow hemodynamics were seen in the left subclavian artery.   Coronary Ca Score 11/2020: IMPRESSION: Coronary calcium score of 102 in the RCA and LAD. This was 96th percentile for age and sex matched control.   Scattered calcifications in the ascending aorta consistent with aortic atherosclerosis.   Recommend aggressive risk factor modifcation.  Carotid Ultrasound 10/2021: Summary:  Right Carotid: There is no evidence of stenosis in the right ICA. Non-hemodynamically significant plaque <50% noted in the CCA. The extracranial vessels were near-normal with only minimal wall thickening or plaque.   Left Carotid: Velocities in the left ICA are consistent with a 1-39% stenosis. The ECA appears >50% stenosed.   Vertebrals:  Bilateral vertebral arteries demonstrate antegrade flow.   Subclavians: Right subclavian artery flow was disturbed. Normal flow  hemodynamics were seen in the left subclavian artery.   Exercise stress 2018: ECG Baseline ECG exhibits normal sinus rhythm..  Stress Findings The patient exercised following the Bruce protocol.   The patient reported no symptoms during the stress test. The patient experienced no angina during the stress test.   The patient requested the test to be stopped. The test was stopped because  the patient complained of fatigue and shortness of breath.   Blood pressure and heart rate demonstrated a normal response to exercise. Blood pressure demonstrated a normal response to exercise. Overall, the patient's exercise capacity was mildly impaired.   85% of maximum heart rate was achieved after 4.2 minutes.  Recovery time:  5 minutes.  The patient's response to exercise was adequate for diagnosis.  Response to Stress There was no ST segment deviation noted during stress.  Arrhythmias during stress:  none.   Arrhythmias during recovery:  none.     There were no significant arrhythmias noted during the test.   ECG was interpretable.      EKG:   02/25/22:  NSR, HR 88 bpm 01/13/20: NSR with HR 81 ECG 2018: NSR; no ischemia, no block  Recent Labs: No results found for requested labs within last 365 days.  Recent Lipid Panel    Component Value Date/Time   CHOL 160 03/11/2022 0757   TRIG 184 (H) 03/11/2022 0757   HDL 37 (L) 03/11/2022 0757   CHOLHDL 4.3 03/11/2022 0757   LDLCALC 91 03/11/2022 0757      Physical Exam:    VS:  There were no vitals taken for this visit.    Wt Readings from Last 3 Encounters:  02/25/22 154 lb 9.6 oz (70.1 kg)  09/26/21 151 lb 9.6 oz (68.8 kg)  11/11/20 142 lb 9.6 oz (64.7 kg)     GEN:  Well nourished, well developed in no acute distress HEENT: Normal NECK: No JVD; No carotid bruits CARDIAC: RRR, no murmurs, rubs, gallops RESPIRATORY:  Clear to auscultation without rales, wheezing or rhonchi  ABDOMEN: Soft, non-tender, non-distended MUSCULOSKELETAL:  No  edema; No deformity  SKIN: Warm and dry NEUROLOGIC:  Alert and oriented x 3 PSYCHIATRIC:  Normal affect   ASSESSMENT:    No diagnosis found.   PLAN:    In order of problems listed above:  #Coronary Calcification: #Family history of premature CAD: #HLD: Ca score 102 which is the 96% for age and sex matched controls. Now on aggressive risk factor modification. -Continue lipitor 40mg  daily -Has successfully quit smoking -Lifestyle modifications as detailed below  #SLE with history of splenic infarct. Managed by hematology now on lifelong [facility] with warfarin. No further arterial or venous thrombosis since starting AC. -Continue lifelong warfarin -Management per Hematology  #DOE: Likely secondary to prolonged smoking history. TTE with LVEF 60-65%, normal RV, no significant valve disease.   #History of Tobacco abuse: Successfully quit  #Hypothyroidism: -Continue synthroid  Exercise recommendations: Goal of exercising for at least 30 minutes a day, at least 5 times per week.  Please exercise to a moderate exertion.  This means that while exercising it is difficult to speak in full sentences, however you are not so short of breath that you feel you must stop, and not so comfortable that you can carry on a full conversation.  Exertion level should be approximately a 5/10, if 10 is the most exertion you can perform.  Diet recommendations: Recommend a heart healthy diet such as the Mediterranean diet.  This diet consists of plant based foods, healthy fats, lean meats, olive oil.  It suggests limiting the intake of simple carbohydrates such as white breads, pastries, and pastas.  It also limits the amount of red meat, wine, and dairy products such as cheese that one should consume on a daily basis.    Medication Adjustments/Labs and Tests Ordered: Current medicines are reviewed at length with the patient today.  Concerns regarding medicines are outlined above.  No orders of the defined  types were placed in this encounter.  No orders of the defined types were placed in this encounter.   There are no Patient Instructions on file for this visit.   Signed, Meriam Sprague, MD  09/29/2022 12:48 PM    [facility]

## 2022-10-05 ENCOUNTER — Encounter: Payer: Self-pay | Admitting: Cardiology

## 2022-10-05 ENCOUNTER — Telehealth: Payer: Self-pay | Admitting: *Deleted

## 2022-10-05 ENCOUNTER — Ambulatory Visit: Payer: BC Managed Care – PPO | Attending: Cardiology | Admitting: Cardiology

## 2022-10-05 VITALS — BP 110/58 | HR 87 | Ht 60.0 in | Wt 152.8 lb

## 2022-10-05 DIAGNOSIS — Z8249 Family history of ischemic heart disease and other diseases of the circulatory system: Secondary | ICD-10-CM

## 2022-10-05 DIAGNOSIS — I2510 Atherosclerotic heart disease of native coronary artery without angina pectoris: Secondary | ICD-10-CM

## 2022-10-05 DIAGNOSIS — E039 Hypothyroidism, unspecified: Secondary | ICD-10-CM

## 2022-10-05 DIAGNOSIS — E785 Hyperlipidemia, unspecified: Secondary | ICD-10-CM

## 2022-10-05 DIAGNOSIS — E7800 Pure hypercholesterolemia, unspecified: Secondary | ICD-10-CM | POA: Diagnosis not present

## 2022-10-05 DIAGNOSIS — R0602 Shortness of breath: Secondary | ICD-10-CM

## 2022-10-05 MED ORDER — ATORVASTATIN CALCIUM 40 MG PO TABS: 40.0000 mg | ORAL_TABLET | Freq: Every day | ORAL | 3 refills | Status: DC

## 2022-10-05 NOTE — Telephone Encounter (Signed)
-----   Message from Delynn Flavin sent at 10/05/2022 10:20 AM EDT ----- Regarding: RE: pt needs a lab appt in 8 weeks per Dr. Johney Frame Patient wanted to call back to schedule once she checked her calender. I noted it in the request notes. I told her it would be a longer wait on the phone and said she would she anyway.  ----- Message ----- From: Nuala Alpha, LPN Sent: [id number]   9:48 AM EDT To: [facility] Subject: pt needs a lab appt in 8 weeks per Dr. Filomena Jungling  We saw this pt in clinic today and sent her to checkout.   She was to have a lab appt to recheck lipids in 8 weeks.  Looks like this may have been missed.  Can you please call her and get this scheduled and let us know when you do so we can put in the note?   Thanks for all you do, Karlene Einstein

## 2022-10-05 NOTE — Progress Notes (Signed)
Cardiology Office Note:    Date:  10/05/2022   ID:  [name], DOB 1966-07-02, MRN [id number]  PCP:  Laurie Camp, MD  [facility] Cardiologist:  Laurie Sprague, MD  [facility] Electrophysiologist:  None   Referring MD: Laurie Camp, MD    History of Present Illness:    [name] is a 56 y.o. 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  Was previously seen by Dr. Delton See in 2018 where the patient was complaining of DOE and chest pressure. Underwent exercise stress test where she achieved 7 METs; no electrocardiographic evidence of ischemia. Also has a history of splenic infarct in setting of SLE found to be lupus anticoagulant positive now on lifelong warfarin. Also with family history of CAD with sister passing away at age 47 with SCD.   Was seen by me in 11/2020 where we obtained Ca score which was 102. Was restarted on lipitor.  Saw Nada Boozer on 09/2021 where she was doing better. Had cut back to 1-2 cigarettes per week. Carotid ultrasound with mild disease bilaterally.  Was last seen in clinic on 02/2022 where she was doing well from CV standpoint. Had successfully quit smoking! TTE was obtained due to mild SOB which showed LVEF 60-65%, normal GLS, normal RV, trivial MR.   Today, the patient states that she is not experiencing any recurring chest pain or shortness of breath.   At home her blood pressures have usually been well controlled. In clinic today it is 110/58.  For a while now she has not been able to obtain her Lipitor from her pharmacy.  She is tolerating her warfarin. No bleeding issues.  For a few weeks she resumed smoking due to stress. She smoked a few cigarettes intermittently every couple of days. Lately she is doing better with cutting back again.  She denies any palpitations, or peripheral edema. No lightheadedness, headaches, syncope, orthopnea, or PND.  Past Medical  History:  Diagnosis Date   Thyroid disease     Past Surgical History:  Procedure Laterality Date   BREAST ENHANCEMENT SURGERY Bilateral    CESAREAN SECTION     CHOLECYSTECTOMY     TOTAL ABDOMINAL HYSTERECTOMY     TUBAL LIGATION      Current Medications: Current Meds  Medication Sig   atorvastatin (LIPITOR) 40 MG tablet TAKE 1 TABLET BY MOUTH EVERY DAY   ezetimibe (ZETIA) 10 MG tablet Take 1 tablet (10 mg total) by mouth daily.   hydroxychloroquine (PLAQUENIL) 200 MG tablet Take 400 mg by mouth daily.   levothyroxine (SYNTHROID) 125 MCG tablet Take 1 tablet (125 mcg total) by mouth daily before breakfast.   warfarin (COUMADIN) 7.5 MG tablet Take 7.5 mg by mouth daily.     Allergies:   Patient has no allergy information on record.   Social History   Socioeconomic History   Marital status: Married    Spouse name: Not on file   Number of children: Not on file   Years of education: Not on file   Highest education level: Not on file  Occupational History   Not on file  Tobacco Use   Smoking status: Former   Smokeless tobacco: Never  Vaping Use   Vaping Use: Never used  Substance and Sexual Activity   Alcohol use: Yes    Comment: occasionally   Drug use: No   Sexual activity: Not on file  Other Topics  Concern   Not on file  Social History Narrative   Not on file   Social Determinants of Health   Financial Resource Strain: Not on file  Food Insecurity: Not on file  Transportation Needs: Not on file  Physical Activity: Not on file  Stress: Not on file  Social Connections: Not on file     Family History: The patient's family history includes Cancer in her mother; Heart disease in her father.  ROS:   Please see the history of present illness.    Review of Systems  Constitutional:  Negative for chills, fever and malaise/fatigue.  HENT:  Negative for congestion.   Eyes:  Negative for blurred vision.  Respiratory:  Negative for shortness of breath.    Cardiovascular:  Negative for chest pain, palpitations, orthopnea, claudication, leg swelling and PND.  Gastrointestinal:  Negative for heartburn, nausea and vomiting.  Genitourinary:  Negative for hematuria.  Musculoskeletal:  Negative for falls.  Skin:  Negative for rash.  Neurological:  Negative for dizziness and loss of consciousness.  Endo/Heme/Allergies:  Negative for polydipsia.  Psychiatric/Behavioral:  The patient is nervous/anxious.     EKGs/Labs/Other Studies Reviewed:    The following studies were reviewed today:  TTE 03/2022: IMPRESSIONS    1. Left ventricular ejection fraction, by estimation, is 60 to 65%. The  left ventricle has normal function. The left ventricle has no regional  wall motion abnormalities. Left ventricular diastolic parameters were  normal. The average left ventricular  global longitudinal strain is -23.4 %. The global longitudinal strain is  normal.   2. Right ventricular systolic function is normal. The right ventricular  size is normal.   3. The mitral valve is abnormal. Trivial mitral valve regurgitation. No  evidence of mitral stenosis.   4. The aortic valve is tricuspid. There is mild calcification of the  aortic valve. Aortic valve regurgitation is not visualized. Aortic valve  sclerosis is present, with no evidence of aortic valve stenosis.   5. The inferior vena cava is normal in size with greater than 50%  respiratory variability, suggesting right atrial pressure of 3 mmHg.   Carotid Duplex 10/10/21 Summary:  Right Carotid: There is no evidence of stenosis in the right ICA. Non-hemodynamically significant plaque <50% noted in the  CCA. The  extracranial vessels were near-normal with only minimal wall thickening or plaque.   Left Carotid: Velocities in the left ICA are consistent with a 1-39% stenosis. The ECA appears >50% stenosed.   Vertebrals:  Bilateral vertebral arteries demonstrate antegrade flow.   Subclavians: Right  subclavian artery flow was disturbed. Normal flow hemodynamics were seen in the left subclavian artery.   Coronary Ca Score 11/2020: IMPRESSION: Coronary calcium score of 102 in the RCA and LAD. This was 96th percentile for age and sex matched control.   Scattered calcifications in the ascending aorta consistent with aortic atherosclerosis.   Recommend aggressive risk factor modifcation.  Exercise stress 2018: ECG Baseline ECG exhibits normal sinus rhythm..  Stress Findings The patient exercised following the Bruce protocol.   The patient reported no symptoms during the stress test. The patient experienced no angina during the stress test.   The patient requested the test to be stopped. The test was stopped because  the patient complained of fatigue and shortness of breath.   Blood pressure and heart rate demonstrated a normal response to exercise. Blood pressure demonstrated a normal response to exercise. Overall, the patient's exercise capacity was mildly impaired.   85% of maximum  heart rate was achieved after 4.2 minutes.  Recovery time:  5 minutes.  The patient's response to exercise was adequate for diagnosis.  Response to Stress There was no ST segment deviation noted during stress.  Arrhythmias during stress:  none.   Arrhythmias during recovery:  none.     There were no significant arrhythmias noted during the test.   ECG was interpretable.      EKG:  EKG is personally reviewed. 10/05/2022:  EKG was not ordered. 02/25/22: NSR, HR 88 bpm 01/13/20: NSR with HR 81 2018: NSR; no ischemia, no block  Recent Labs: No results found for requested labs within last 365 days.   Recent Lipid Panel    Component Value Date/Time   CHOL 160 03/11/2022 0757   TRIG 184 (H) 03/11/2022 0757   HDL 37 (L) 03/11/2022 0757   CHOLHDL 4.3 03/11/2022 0757   LDLCALC 91 03/11/2022 0757      Physical Exam:    VS:  BP (!) 110/58   Pulse 87   Ht 5' (1.524 m)   Wt 152 lb 12.8 oz (69.3 kg)    SpO2 96%   BMI 29.84 kg/m     Wt Readings from Last 3 Encounters:  10/05/22 152 lb 12.8 oz (69.3 kg)  02/25/22 154 lb 9.6 oz (70.1 kg)  09/26/21 151 lb 9.6 oz (68.8 kg)     GEN:  Well nourished, well developed in no acute distress HEENT: Normal NECK: No JVD; No carotid bruits CARDIAC: RRR, no murmurs, rubs, gallops RESPIRATORY:  Clear to auscultation without rales, wheezing or rhonchi  ABDOMEN: Soft, non-tender, non-distended MUSCULOSKELETAL:  No edema; No deformity  SKIN: Warm and dry NEUROLOGIC:  Alert and oriented x 3 PSYCHIATRIC:  Normal affect   ASSESSMENT:    1. Family history of early CAD   2. Elevated LDL cholesterol level   3. Hyperlipidemia, unspecified hyperlipidemia type   4. Coronary artery disease involving native coronary artery of native heart without angina pectoris   5. SOB (shortness of breath)   6. Hypothyroidism, unspecified type      PLAN:    In order of problems listed above:  #Coronary Calcification: #Family history of premature CAD: #HLD: Ca score 102 which is the 96% for age and sex matched controls. Now on aggressive risk factor modification. Doing well without anginal symptoms. -Continue lipitor 40mg  daily -Continue zetia 10mg  daily -Goal LDL<70 -Smoking cessation; patient is working on doing this again -Lifestyle modifications as detailed below  #SLE with history of splenic infarct. Managed by hematology now on lifelong [facility] with warfarin. No further arterial or venous thrombosis since starting AC. -Continue lifelong warfarin -Management per Hematology  #DOE: Likely secondary to prolonged smoking history. TTE with LVEF 60-65%, normal RV, no significant valve disease.   #History of Tobacco abuse: Resumed smoking a few cigarettes but is determined to quit again. Encouraged cessation  #Hypothyroidism: -Continue synthroid  Exercise recommendations: Goal of exercising for at least 30 minutes a day, at least 5 times per week.  Please  exercise to a moderate exertion.  This means that while exercising it is difficult to speak in full sentences, however you are not so short of breath that you feel you must stop, and not so comfortable that you can carry on a full conversation.  Exertion level should be approximately a 5/10, if 10 is the most exertion you can perform.  Diet recommendations: Recommend a heart healthy diet such as the Mediterranean diet.  This diet consists of  plant based foods, healthy fats, lean meats, olive oil.  It suggests limiting the intake of simple carbohydrates such as white breads, pastries, and pastas.  It also limits the amount of red meat, wine, and dairy products such as cheese that one should consume on a daily basis.   Follow-up:  1 year.  Medication Adjustments/Labs and Tests Ordered: Current medicines are reviewed at length with the patient today.  Concerns regarding medicines are outlined above.   No orders of the defined types were placed in this encounter.  No orders of the defined types were placed in this encounter.  There are no Patient Instructions on file for this visit.  I,Mathew Stumpf,acting as a Neurosurgeon for Laurie Sprague, MD.,have documented all relevant documentation on the behalf of Laurie Sprague, MD,as directed by  Laurie Sprague, MD while in the presence of Laurie Sprague, MD.  I, Laurie Sprague, MD, have reviewed all documentation for this visit. The documentation on 10/05/22 for the exam, diagnosis, procedures, and orders are all accurate and complete.   Signed, Laurie Sprague, MD  10/05/2022 9:12 AM    [facility]

## 2022-10-05 NOTE — Patient Instructions (Signed)
Medication Instructions:   Your physician recommends that you continue on your current medications as directed. Please refer to the Current Medication list given to you today.  *If you need a refill on your cardiac medications before your next appointment, please call your pharmacy*   Lab Work:  IN [place] OFFICE--CHECK LIPIDS AT THAT TIME--PLEASE COME FASTING TO THIS LAB APPOINTMENT  If you have labs (blood work) drawn today and your tests are completely normal, you will receive your results only by: [place] (if you have MyChart) OR A paper copy in the mail If you have any lab test that is abnormal or we need to change your treatment, we will call you to review the results.    Follow-Up: At [facility], you and your health needs are our priority.  As part of our continuing mission to provide you with exceptional heart care, we have created designated Provider Care Teams.  These Care Teams include your primary Cardiologist (physician) and Advanced Practice Providers (APPs -  Physician Assistants and Nurse Practitioners) who all work together to provide you with the care you need, when you need it.  We recommend signing up for the patient portal called "MyChart".  Sign up information is provided on this After Visit Summary.  MyChart is used to connect with patients for Virtual Visits (Telemedicine).  Patients are able to view lab/test results, encounter notes, upcoming appointments, etc.  Non-urgent messages can be sent to your provider as well.   To learn more about what you can do with MyChart, go to [email or web address].    Your next appointment:   1 year(s)  The format for your next appointment:   In Person  Provider:   Freada Bergeron, MD      Important Information About Sugar

## 2022-11-02 DIAGNOSIS — H5713 Ocular pain, bilateral: Secondary | ICD-10-CM | POA: Diagnosis not present

## 2022-11-03 DIAGNOSIS — Z7901 Long term (current) use of anticoagulants: Secondary | ICD-10-CM | POA: Diagnosis not present

## 2022-11-12 DIAGNOSIS — Z7901 Long term (current) use of anticoagulants: Secondary | ICD-10-CM | POA: Diagnosis not present

## 2022-11-26 DIAGNOSIS — Z7901 Long term (current) use of anticoagulants: Secondary | ICD-10-CM | POA: Diagnosis not present

## 2022-12-16 DIAGNOSIS — Z7901 Long term (current) use of anticoagulants: Secondary | ICD-10-CM | POA: Diagnosis not present

## 2023-01-14 DIAGNOSIS — Z7901 Long term (current) use of anticoagulants: Secondary | ICD-10-CM | POA: Diagnosis not present

## 2023-01-21 DIAGNOSIS — Z7901 Long term (current) use of anticoagulants: Secondary | ICD-10-CM | POA: Diagnosis not present

## 2023-02-17 DIAGNOSIS — Z7901 Long term (current) use of anticoagulants: Secondary | ICD-10-CM | POA: Diagnosis not present

## 2023-02-23 DIAGNOSIS — Z7901 Long term (current) use of anticoagulants: Secondary | ICD-10-CM | POA: Diagnosis not present

## 2023-03-31 DIAGNOSIS — Z7901 Long term (current) use of anticoagulants: Secondary | ICD-10-CM | POA: Diagnosis not present

## 2023-04-27 DIAGNOSIS — Z7901 Long term (current) use of anticoagulants: Secondary | ICD-10-CM | POA: Diagnosis not present

## 2023-05-05 ENCOUNTER — Other Ambulatory Visit: Payer: Self-pay

## 2023-06-17 DIAGNOSIS — Z7901 Long term (current) use of anticoagulants: Secondary | ICD-10-CM | POA: Diagnosis not present

## 2023-07-15 DIAGNOSIS — Z7901 Long term (current) use of anticoagulants: Secondary | ICD-10-CM | POA: Diagnosis not present

## 2023-07-21 DIAGNOSIS — Z7901 Long term (current) use of anticoagulants: Secondary | ICD-10-CM | POA: Diagnosis not present

## 2023-07-22 DIAGNOSIS — H05011 Cellulitis of right orbit: Secondary | ICD-10-CM | POA: Diagnosis not present

## 2023-07-28 DIAGNOSIS — Z7901 Long term (current) use of anticoagulants: Secondary | ICD-10-CM | POA: Diagnosis not present

## 2023-08-03 DIAGNOSIS — R410 Disorientation, unspecified: Secondary | ICD-10-CM | POA: Diagnosis not present

## 2023-08-04 ENCOUNTER — Encounter: Payer: Self-pay | Admitting: Physician Assistant

## 2023-08-23 NOTE — Progress Notes (Signed)
Assessment/Plan:   [name] is a very pleasant 57 y.o. year old RH female with a history of hypothyroidism, GERD, hyperlipidemia, SLE, history of RLE DVTon coumadin, history of infarction of spleen, tobacco abuse, vitamin D deficiency, anxiety, seen today for evaluation of memory loss. MoCA today is 24/30, Workup is in progress at this time.      Memory Impairment of unclear etiology  MRI brain without contrast to assess for underlying structural abnormality and assess vascular load  Check B12, TSH Continue to control mood as per PCP Discontinue smoking Recommend good control of cardiovascular risk factors Referral to psychotherapy for anxiety  Folllow up in 2 weeks     Subjective:   The patient is here alone   How long did patient have memory difficulties? For the last 2 years when she started a new job and found it hard to comprehend the instructions "nothing was sinking in", lately, since her job has become more complex and she has more anxiety which affects her memory. Reports some difficulty remembering new information, conversations and names.She finds herself unable to finish the task at times.  "At times, I read  but if someone interrupts me and my brain will not go any further ". She has to stop to think about how to pronounce the word and has to use a different word.  At work, she may grab the wrong file not realizing what she did.  She is unsure if she has any attention deficit . She does not do any brain games.  Long-term memory is good but "I don't really look back".   repeats oneself?  Endorsed, for the last 2 years Disoriented when walking into a room?  Patient denies Leaving objects in unusual places? "Everybody does".   Wandering behavior?  Denies Any personality changes?  Denies.   Any history of depression?:  Denies   Hallucinations or paranoia?  Denies   Seizures?  Denies    Any sleep changes? "Does not sleep well, I do not feel rested upon waking up, but  for the last year, since starting this work from home, but I like my job". Denies vivid dreams, REM behavior or sleepwalking   Sleep apnea?  Denies   Any hygiene concerns?  Denies   Independent of bathing and dressing? Endorsed  Does the patient needs help with medications? Patient is in charge. I have been missing meds but it is because I do not have a routine" "Monday to Friday I might remember because I have the meds next to the computer, but Sat and Sunday I don't"  Who is in charge of the finances? Patient is in charge.      Any changes in appetite?  Denies     Patient have trouble swallowing? Denies.   Does the patient cook?  Not much. Denies any cooking accidents. Any kitchen accidents such as leaving the stove on? Denies.   Any history of headaches?  Denies.   Chronic pain ? Denies.   Ambulates with difficulty?  Denies.   Recent falls or head injuries? Denies.   Vision changes? Denies.   Unilateral weakness, numbness or tingling? Denies.   Any tremors?   Denies.   Any anosmia?  Denies.   Any incontinence of urine? Denies.   Any bowel dysfunction? Denies.      Patient lives with husband and 2 daughters and her son. History of heavy alcohol intake? Denies.   History of heavy tobacco use? Initially quit at  age 38, then resumed about 4 years ago.  Family history of dementia?  Maternal grandmother AD  Does patient drive? Yes, denies any issues, denies getting lost     Past Medical History:  Diagnosis Date   Hyperlipidemia    Lupus (HCC)    Thyroid disease      Past Surgical History:  Procedure Laterality Date   BREAST ENHANCEMENT SURGERY Bilateral    CESAREAN SECTION     CHOLECYSTECTOMY     gallbladder removed     TOTAL ABDOMINAL HYSTERECTOMY     TUBAL LIGATION       No Known Allergies  Current Outpatient Medications  Medication Instructions   atorvastatin (LIPITOR) 40 mg, Oral, Daily   ezetimibe (ZETIA) 10 mg, Oral, Daily   hydroxychloroquine (PLAQUENIL) 400 mg,  Oral, Daily   levothyroxine (SYNTHROID) 125 mcg, Oral, Daily before breakfast   warfarin (COUMADIN) 7.5 mg, Oral, Daily     VITALS:   Vitals:   08/24/23 0806 08/24/23 0908  BP: (!) 142/84 128/78  Pulse: 84   Resp: 20   SpO2: 95%   Weight: 140 lb (63.5 kg)   Height: 5' (1.524 m)        No data to display          PHYSICAL EXAM   HEENT:  Normocephalic, atraumatic. The superficial temporal arteries are without ropiness or tenderness. Cardiovascular: Regular rate and rhythm. Lungs: Clear to auscultation bilaterally. Neck: There are very soft carotid bruits noted bilaterally.  NEUROLOGICAL:    08/24/2023   12:00 PM  Montreal Cognitive Assessment   Visuospatial/ Executive (0/5) 3  Naming (0/3) 3  Attention: Read list of digits (0/2) 2  Attention: Read list of letters (0/1) 1  Attention: Serial 7 subtraction starting at 100 (0/3) 3  Language: Repeat phrase (0/2) 2  Language : Fluency (0/1) 0  Abstraction (0/2) 0  Delayed Recall (0/5) 4  Orientation (0/6) 6  Total 24  Adjusted Score (based on education) 24        No data to display           Orientation:  Alert and oriented to person, place and time. No aphasia or dysarthria. Fund of knowledge is appropriate. Recent memory impaired and remote memory intact.  Attention and concentration are reduced. Able to name objects and repeat phrases. Delayed recall 4/5 Cranial nerves: There is good facial symmetry. Extraocular muscles are intact and visual fields are full to confrontational testing. Speech is fluent and clear. No tongue deviation. Hearing is intact to conversational tone. Tone: Tone is good throughout. Sensation: Sensation is intact to light touch. Vibration is intact at the bilateral big toe.  Coordination: The patient has no difficulty with RAM's or FNF bilaterally. Normal finger to nose  Motor: Strength is 5/5 in the bilateral upper and lower extremities. There is no pronator drift. There are no  fasciculations noted. DTR's: Deep tendon reflexes are 2/4 bilaterally. Gait and Station: The patient is able to ambulate without difficulty.The patient is able to heel toe walk without any difficulty. Gait is cautious and narrow. The patient is able to ambulate in a tandem fashion.       Thank you for allowing [place] the opportunity to participate in the care of this nice patient. Please do not hesitate to contact us for any questions or concerns.   Total time spent on today's visit was 60 minutes dedicated to this patient today, preparing to see patient, examining the patient, ordering tests and/or medications  and counseling the patient, documenting clinical information in the EHR or other health record, independently interpreting results and communicating results to the patient/family, discussing treatment and goals, answering patient's questions and coordinating care.  Cc:  Candice Camp, MD  Marlowe Kays 08/24/2023 12:51 PM

## 2023-08-24 ENCOUNTER — Telehealth: Payer: Self-pay

## 2023-08-24 ENCOUNTER — Encounter: Payer: Self-pay | Admitting: Physician Assistant

## 2023-08-24 ENCOUNTER — Other Ambulatory Visit (INDEPENDENT_AMBULATORY_CARE_PROVIDER_SITE_OTHER): Payer: BC Managed Care – PPO

## 2023-08-24 ENCOUNTER — Ambulatory Visit: Payer: BC Managed Care – PPO

## 2023-08-24 ENCOUNTER — Ambulatory Visit (INDEPENDENT_AMBULATORY_CARE_PROVIDER_SITE_OTHER): Payer: BC Managed Care – PPO | Admitting: Physician Assistant

## 2023-08-24 VITALS — BP 128/78 | HR 84 | Resp 20 | Ht 60.0 in | Wt 140.0 lb

## 2023-08-24 DIAGNOSIS — F419 Anxiety disorder, unspecified: Secondary | ICD-10-CM

## 2023-08-24 DIAGNOSIS — R413 Other amnesia: Secondary | ICD-10-CM | POA: Diagnosis not present

## 2023-08-24 DIAGNOSIS — F988 Other specified behavioral and emotional disorders with onset usually occurring in childhood and adolescence: Secondary | ICD-10-CM

## 2023-08-24 DIAGNOSIS — E7800 Pure hypercholesterolemia, unspecified: Secondary | ICD-10-CM

## 2023-08-24 DIAGNOSIS — R0602 Shortness of breath: Secondary | ICD-10-CM

## 2023-08-24 DIAGNOSIS — I2510 Atherosclerotic heart disease of native coronary artery without angina pectoris: Secondary | ICD-10-CM

## 2023-08-24 DIAGNOSIS — Z8249 Family history of ischemic heart disease and other diseases of the circulatory system: Secondary | ICD-10-CM

## 2023-08-24 DIAGNOSIS — E039 Hypothyroidism, unspecified: Secondary | ICD-10-CM | POA: Diagnosis not present

## 2023-08-24 DIAGNOSIS — E785 Hyperlipidemia, unspecified: Secondary | ICD-10-CM

## 2023-08-24 LAB — TSH: TSH: 18.11 u[IU]/mL — ABNORMAL HIGH (ref 0.35–5.50)

## 2023-08-24 LAB — VITAMIN B12: Vitamin B-12: 484 pg/mL (ref 211–911)

## 2023-08-24 NOTE — Telephone Encounter (Signed)
I advised patient of labs, voiced understanding of labs and thanked me for calling. She will follow up on TSH with Her primary. Again, verbally understood results and understanding.

## 2023-08-24 NOTE — Patient Instructions (Addendum)
It was a pleasure to see you today at our office.   Recommendations:   MRI of the brain, the radiology office will call you to arrange you appointment   Increase brain games activities Discontinue smoking Check labs today   Referral to psychotherapy for anxiety  Follow up in Oct 1 at 9 am   For psychiatric meds, mood meds: Please have your primary care physician manage these medications.  If you have any severe symptoms of a stroke, or other severe issues such as confusion,severe chills or fever, etc call 911 or go to the ER as you may need to be evaluated further    For assessment of decision of mental capacity and competency:  Call Dr. Erick Blinks, geriatric psychiatrist at [phone]    Whom to call: Memory  decline, memory medications: Call our office [phone]    [email or web address]   RECOMMENDATIONS FOR ALL PATIENTS WITH MEMORY PROBLEMS: 1. Continue to exercise (Recommend 30 minutes of walking everyday, or 3 hours every week) 2. Increase social interactions - continue going to [place] and enjoy social gatherings with friends and family 3. Eat healthy, avoid fried foods and eat more fruits and vegetables 4. Maintain adequate blood pressure, blood sugar, and blood cholesterol level. Reducing the risk of stroke and cardiovascular disease also helps promoting better memory. 5. Avoid stressful situations. Live a simple life and avoid aggravations. Organize your time and prepare for the next day in anticipation. 6. Sleep well, avoid any interruptions of sleep and avoid any distractions in the bedroom that may interfere with adequate sleep quality 7. Avoid sugar, avoid sweets as there is a strong link between excessive sugar intake, diabetes, and cognitive impairment We discussed the Mediterranean diet, which has been shown to help patients reduce the risk of progressive memory disorders and reduces cardiovascular risk.  This includes eating fish, eat fruits and green leafy vegetables, nuts like almonds and hazelnuts, walnuts, and also use olive oil. Avoid fast foods and fried foods as much as possible. Avoid sweets and sugar as sugar use has been linked to worsening of memory function.  There is always a concern of gradual progression of memory problems. If this is the case, then we may need to adjust level of care according to patient needs. Support, both to the patient and caregiver, should then be put into place.         DRIVING: Regarding driving, in patients with progressive memory problems, driving will be impaired. We advise to have someone else do the driving if trouble finding directions or if minor accidents are reported. Independent driving assessment is available to determine safety of driving.   If you are interested in the driving assessment, you can contact the following:  [facility] [employer] in [place] [phone]  [facility] [phone]  [facility] [phone]  [facility] [phone] or [phone]   FALL PRECAUTIONS: Be cautious when walking. Scan the area for obstacles that may increase the risk of trips and falls. When getting up in the mornings, sit up at the edge of the bed for a few minutes before getting out of bed. Consider elevating the bed at the head end to avoid drop of blood pressure when getting up. Walk always in a well-lit room (use night lights in the walls). Avoid area rugs or power cords from appliances in the middle of the walkways. Use a walker or a cane if necessary and consider physical therapy for balance exercise. Get your eyesight checked regularly.  FINANCIAL OVERSIGHT: Supervision, especially oversight when making financial decisions or transactions is also recommended.  HOME SAFETY: Consider the safety of the kitchen when operating appliances like stoves, microwave oven, and blender. Consider having supervision  and share cooking responsibilities until no longer able to participate in those. Accidents with firearms and other hazards in the house should be identified and addressed as well.   ABILITY TO BE LEFT ALONE: If patient is unable to contact 911 operator, consider using LifeLine, or when the need is there, arrange for someone to stay with patients. Smoking is a fire hazard, consider supervision or cessation. Risk of wandering should be assessed by caregiver and if detected at any point, supervision and safe proof recommendations should be instituted.  MEDICATION SUPERVISION: Inability to self-administer medication needs to be constantly addressed. Implement a mechanism to ensure safe administration of the medications.      Mediterranean Diet A Mediterranean diet refers to food and lifestyle choices that are based on the traditions of countries located on the [employer] [place]. This way of eating has been shown to help prevent certain conditions and improve outcomes for people who have chronic diseases, like kidney disease and heart disease. What are tips for following this plan? Lifestyle  Cook and eat meals together with your family, when possible. Drink enough fluid to keep your urine clear or pale yellow. Be physically active every day. This includes: Aerobic exercise like running or swimming. Leisure activities like gardening, walking, or housework. Get 7-8 hours of sleep each night. If recommended by your health care provider, drink red wine in moderation. This means 1 glass a day for nonpregnant women and 2 glasses a day for men. A glass of wine equals 5 oz (150 mL). Reading food labels  Check the serving size of packaged foods. For foods such as rice and pasta, the serving size refers to the amount of cooked product, not dry. Check the total fat in packaged foods. Avoid foods that have saturated fat or trans fats. Check the ingredients list for added sugars, such as corn  syrup. Shopping  At the grocery store, buy most of your food from the areas near the walls of the store. This includes: Fresh fruits and vegetables (produce). Grains, beans, nuts, and seeds. Some of these may be available in unpackaged forms or large amounts (in bulk). Fresh seafood. Poultry and eggs. Low-fat dairy products. Buy whole ingredients instead of prepackaged foods. Buy fresh fruits and vegetables in-season from local farmers markets. Buy frozen fruits and vegetables in resealable bags. If you do not have access to quality fresh seafood, buy precooked frozen shrimp or canned fish, such as tuna, salmon, or sardines. Buy small amounts of raw or cooked vegetables, salads, or olives from the deli or salad bar at your store. Stock your pantry so you always have certain foods on hand, such as olive oil, canned tuna, canned tomatoes, rice, pasta, and beans. Cooking  Cook foods with extra-virgin olive oil instead of using butter or other vegetable oils. Have meat as a side dish, and have vegetables or grains as your main dish. This means having meat in small portions or adding small amounts of meat to foods like pasta or stew. Use beans or vegetables instead of meat in common dishes like chili or lasagna. Experiment with different cooking methods. Try roasting or broiling vegetables instead of steaming or sauteing them. Add frozen vegetables to soups, stews, pasta, or rice. Add nuts or seeds for added healthy fat at each  meal. You can add these to yogurt, salads, or vegetable dishes. Marinate fish or vegetables using olive oil, lemon juice, garlic, and fresh herbs. Meal planning  Plan to eat 1 vegetarian meal one day each week. Try to work up to 2 vegetarian meals, if possible. Eat seafood 2 or more times a week. Have healthy snacks readily available, such as: Vegetable sticks with hummus. Greek yogurt. Fruit and nut trail mix. Eat balanced meals throughout the week. This  includes: Fruit: 2-3 servings a day Vegetables: 4-5 servings a day Low-fat dairy: 2 servings a day Fish, poultry, or lean meat: 1 serving a day Beans and legumes: 2 or more servings a week Nuts and seeds: 1-2 servings a day Whole grains: 6-8 servings a day Extra-virgin olive oil: 3-4 servings a day Limit red meat and sweets to only a few servings a month What are my food choices? Mediterranean diet Recommended Grains: Whole-grain pasta. Brown rice. Bulgar wheat. Polenta. Couscous. Whole-wheat bread. Orpah Cobb. Vegetables: Artichokes. Beets. Broccoli. Cabbage. Carrots. Eggplant. Green beans. Chard. Kale. Spinach. Onions. Leeks. Peas. Squash. Tomatoes. Peppers. Radishes. Fruits: Apples. Apricots. Avocado. Berries. Bananas. Cherries. Dates. Figs. Grapes. Lemons. Melon. Oranges. Peaches. Plums. Pomegranate. Meats and other protein foods: Beans. Almonds. Sunflower seeds. Pine nuts. Peanuts. Cod. Salmon. Scallops. Shrimp. Tuna. Tilapia. Clams. Oysters. Eggs. Dairy: Low-fat milk. Cheese. Greek yogurt. Beverages: Water. Red wine. Herbal tea. Fats and oils: Extra virgin olive oil. Avocado oil. Grape seed oil. Sweets and desserts: [place] yogurt with honey. Baked apples. Poached pears. Trail mix. Seasoning and other foods: Basil. Cilantro. Coriander. Cumin. Mint. Parsley. Sage. Rosemary. Tarragon. Garlic. Oregano. Thyme. Pepper. Balsalmic vinegar. Tahini. Hummus. Tomato sauce. Olives. Mushrooms. Limit these Grains: Prepackaged pasta or rice dishes. Prepackaged cereal with added sugar. Vegetables: Deep fried potatoes (french fries). Fruits: Fruit canned in syrup. Meats and other protein foods: Beef. Pork. Lamb. Poultry with skin. Hot dogs. Tomasa Blase. Dairy: Ice cream. Sour cream. Whole milk. Beverages: Juice. Sugar-sweetened soft drinks. Beer. Liquor and spirits. Fats and oils: Butter. Canola oil. Vegetable oil. Beef fat (tallow). Lard. Sweets and desserts: Cookies. Cakes. Pies. Candy. Seasoning  and other foods: Mayonnaise. Premade sauces and marinades. The items listed may not be a complete list. Talk with your dietitian about what dietary choices are right for you. Summary The Mediterranean diet includes both food and lifestyle choices. Eat a variety of fresh fruits and vegetables, beans, nuts, seeds, and whole grains. Limit the amount of red meat and sweets that you eat. Talk with your health care provider about whether it is safe for you to drink red wine in moderation. This means 1 glass a day for nonpregnant women and 2 glasses a day for men. A glass of wine equals 5 oz (150 mL). This information is not intended to replace advice given to you by your health care provider. Make sure you discuss any questions you have with your health care provider. Document Released: 07/16/2016 Document Revised: 08/18/2016 Document Reviewed: 07/16/2016 Elsevier Interactive Patient Education  2017 [employer].   [address] for labs [place] [facility] [phone]

## 2023-08-27 ENCOUNTER — Telehealth: Payer: Self-pay | Admitting: *Deleted

## 2023-08-27 DIAGNOSIS — E7800 Pure hypercholesterolemia, unspecified: Secondary | ICD-10-CM

## 2023-08-27 MED ORDER — ATORVASTATIN CALCIUM 80 MG PO TABS: 80.0000 mg | ORAL_TABLET | Freq: Every day | ORAL | 3 refills | Status: DC

## 2023-08-27 NOTE — Telephone Encounter (Signed)
Reviewed results w patient.  She had been missing doses of atorvastatin 40 mg but is ready to restart taking medication regularly.  She will increase to 80 mg daily since LDL is above goal and will continue Zetia.  Will check labs on 10/22/23.  She also had not been taking levothyroxine regularly but does have the medication and has started back on it.  Forwarding her lab results to PCP as he manages her thyroid and she has an upcoming appointment there.

## 2023-08-27 NOTE — Telephone Encounter (Signed)
-----   Message from Orbie Pyo sent at 08/27/2023  9:47 AM EDT ----- Given presence of coronary artery calcification the patient's LDL should be less than 70.  Increase atorvastatin to 80 mg and continue Zetia.  Lipid panel and LFTs as well as LP(a) in 2 months. ----- Message ----- From: Loa Socks, LPN Sent: 2/95/2841   9:02 AM EDT To: Lendon Ka, RN; Orbie Pyo, MD  Former Dr. Shari Prows pt and is scheduled to see you in Oct for establishment.  Please result and review with the pt.  Thank you so much, Ivy ----- Message ----- From: Interface, [facility] Lab Results In Sent: 08/25/2023   7:38 AM EDT To: [address] Triage

## 2023-08-31 DIAGNOSIS — R110 Nausea: Secondary | ICD-10-CM | POA: Diagnosis not present

## 2023-08-31 DIAGNOSIS — M7061 Trochanteric bursitis, right hip: Secondary | ICD-10-CM | POA: Diagnosis not present

## 2023-08-31 DIAGNOSIS — M329 Systemic lupus erythematosus, unspecified: Secondary | ICD-10-CM | POA: Diagnosis not present

## 2023-08-31 DIAGNOSIS — M79644 Pain in right finger(s): Secondary | ICD-10-CM | POA: Diagnosis not present

## 2023-09-07 ENCOUNTER — Telehealth: Payer: BC Managed Care – PPO | Admitting: Physician Assistant

## 2023-09-09 DIAGNOSIS — Z7901 Long term (current) use of anticoagulants: Secondary | ICD-10-CM | POA: Diagnosis not present

## 2023-09-13 DIAGNOSIS — F39 Unspecified mood [affective] disorder: Secondary | ICD-10-CM | POA: Diagnosis not present

## 2023-09-13 DIAGNOSIS — R410 Disorientation, unspecified: Secondary | ICD-10-CM | POA: Diagnosis not present

## 2023-09-20 ENCOUNTER — Encounter: Payer: Self-pay | Admitting: Physician Assistant

## 2023-09-23 DIAGNOSIS — Z01419 Encounter for gynecological examination (general) (routine) without abnormal findings: Secondary | ICD-10-CM | POA: Diagnosis not present

## 2023-09-23 DIAGNOSIS — Z6826 Body mass index (BMI) 26.0-26.9, adult: Secondary | ICD-10-CM | POA: Diagnosis not present

## 2023-09-23 DIAGNOSIS — Z1272 Encounter for screening for malignant neoplasm of vagina: Secondary | ICD-10-CM | POA: Diagnosis not present

## 2023-09-25 ENCOUNTER — Ambulatory Visit
Admission: RE | Admit: 2023-09-25 | Discharge: 2023-09-25 | Disposition: A | Discharge status: Home / Self Care | Payer: BC Managed Care – PPO | Source: Ambulatory Visit | Attending: Physician Assistant | Admitting: Physician Assistant

## 2023-09-25 DIAGNOSIS — R413 Other amnesia: Secondary | ICD-10-CM | POA: Diagnosis not present

## 2023-09-28 DIAGNOSIS — Z7901 Long term (current) use of anticoagulants: Secondary | ICD-10-CM | POA: Diagnosis not present

## 2023-10-01 NOTE — Progress Notes (Deleted)
Cardiology Office Note:   Date:  10/01/2023  ID:  [name], DOB 10-06-1966, MRN [id number] PCP:  Candice Camp, MD  [facility] Providers Cardiologist:  Alverda Skeans, MD Referring MD: Candice Camp, MD  Chief Complaint/Reason for Referral: Cardiology follow-up ASSESSMENT:    1. Coronary artery calcification seen on CAT scan   2. Aortic atherosclerosis (HCC)   3. CKD (chronic kidney disease) stage 2, GFR 60-89 ml/min   4. Secondary hypercoagulable state (HCC)   5. BMI 29.0-29.9,adult   6. Systemic lupus erythematosus, unspecified SLE type, unspecified organ involvement status (HCC)     PLAN:   In order of problems listed above: Coronary artery calcification: Continue Coumadin, Lipitor, and Zetia. Aortic atherosclerosis: Continue Coumadin, Lipitor, and Zetia. CKD stage II: Monitor for now if hypertensive would suggest the patient starts an ARB for renal protection. Secondary hypercoagulable state: Continue Coumadin given splenic infarction and history of lupus. Elevated BMI: Continue diet and exercise modification. Lupus: Continue Plaquenil. Hyperlipidemia: Check lipid panel, LFTs, and LP(a) in November.***        {Are you ordering a CV Procedure (e.g. stress test, cath, DCCV, TEE, etc)?   Press F2        :[id number]}   Dispo:  No follow-ups on file.      Medication Adjustments/Labs and Tests Ordered: Current medicines are reviewed at length with the patient today.  Concerns regarding medicines are outlined above.  The following changes have been made:  {PLAN; NO CHANGE:13088:s}   Labs/tests ordered: No orders of the defined types were placed in this encounter.   Medication Changes: No orders of the defined types were placed in this encounter.   Current medicines are reviewed at length with the patient today.  The patient {ACTIONS; HAS/DOES NOT HAVE:19233} concerns regarding medicines.  I spent *** minutes reviewing all clinical data during and prior to this  visit including all relevant imaging studies, laboratories, clinical information from other health systems, and prior notes from both Cardiology and other specialties, interviewing the patient, and conducting a complete physical examination in order to formulate a comprehensive and personalized evaluation and treatment plan.  History of Present Illness:      FOCUSED PROBLEM LIST:   Coronary artery calcification  Calcium CT 2021 Aortic atherosclerosis Calcium CT 2021 Hyperlipidemia Systemic lupus erythematosus C/b splenic infarction on lifelong anticoagulation with warfarin Hypothyroidism Stage II chronic kidney disease BMI 29 Tobacco abuse  October 2024: The patient is seen for routine follow-up.  She was seen last year and was doing well.  In the interim she had a lipid panel which demonstrated an LDL of 158.  Zetia was added to her regimen.          Current Medications: No outpatient medications have been marked as taking for the 10/06/23 encounter (Appointment) with Orbie Pyo, MD.     Review of Systems:   Please see the history of present illness.    All other systems reviewed and are negative.     EKGs/Labs/Other Test Reviewed:   EKG: EKG that I reviewed March 2023 demonstrates normal sinus rhythm  EKG Interpretation Date/Time:    Ventricular Rate:    PR Interval:    QRS Duration:    QT Interval:    QTC Calculation:   R Axis:      Text Interpretation:           Risk Assessment/Calculations:   {Does this patient have ATRIAL FIBRILLATION?[phone]}      Physical Exam:  VS:  There were no vitals taken for this visit.   No BP recorded.  {Refresh Note OR Click here to enter BP  :1}***   Wt Readings from Last 3 Encounters:  08/24/23 140 lb (63.5 kg)  10/05/22 152 lb 12.8 oz (69.3 kg)  02/25/22 154 lb 9.6 oz (70.1 kg)      GENERAL:  No apparent distress, AOx3 HEENT:  No carotid bruits, +2 carotid impulses, no scleral icterus CAR: RRR Irregular  RR*** no murmurs***, gallops, rubs, or thrills RES:  Clear to auscultation bilaterally ABD:  Soft, nontender, nondistended, positive bowel sounds x 4 VASC:  +2 radial pulses, +2 carotid pulses NEURO:  CN 2-12 grossly intact; motor and sensory grossly intact PSYCH:  No active depression or anxiety EXT:  No edema, ecchymosis, or cyanosis  Signed, Orbie Pyo, MD  10/01/2023 5:12 PM    [facility] [address] [place], [place], [place]  [address] Phone: [phone]; Fax: [phone]   Note:  This document was prepared using Dragon voice recognition software and may include unintentional dictation errors.

## 2023-10-05 DIAGNOSIS — Z7901 Long term (current) use of anticoagulants: Secondary | ICD-10-CM | POA: Diagnosis not present

## 2023-10-06 ENCOUNTER — Ambulatory Visit: Payer: BC Managed Care – PPO | Admitting: Internal Medicine

## 2023-10-06 DIAGNOSIS — D6869 Other thrombophilia: Secondary | ICD-10-CM

## 2023-10-06 DIAGNOSIS — E785 Hyperlipidemia, unspecified: Secondary | ICD-10-CM

## 2023-10-06 DIAGNOSIS — M329 Systemic lupus erythematosus, unspecified: Secondary | ICD-10-CM

## 2023-10-06 DIAGNOSIS — Z6829 Body mass index (BMI) 29.0-29.9, adult: Secondary | ICD-10-CM

## 2023-10-06 DIAGNOSIS — I700 Atherosclerosis of aorta: Secondary | ICD-10-CM

## 2023-10-06 DIAGNOSIS — I2510 Atherosclerotic heart disease of native coronary artery without angina pectoris: Secondary | ICD-10-CM

## 2023-10-06 DIAGNOSIS — N182 Chronic kidney disease, stage 2 (mild): Secondary | ICD-10-CM

## 2023-10-17 DIAGNOSIS — R519 Headache, unspecified: Secondary | ICD-10-CM | POA: Diagnosis not present

## 2023-10-17 DIAGNOSIS — H9209 Otalgia, unspecified ear: Secondary | ICD-10-CM | POA: Diagnosis not present

## 2023-10-17 DIAGNOSIS — R070 Pain in throat: Secondary | ICD-10-CM | POA: Diagnosis not present

## 2023-10-21 NOTE — Progress Notes (Signed)
Great news, your MRI of the brain is completely normal!  There are no indications use of tumor, bleeding, or any shape or size  abnormalities.  There is no indication for memory pill, no suspicion for dementia.  Need to control anxiety, stop smoking, and continue to take good care of the cardiovascular risk factors such as your prior DVT continue Coumadin.  Thank you

## 2023-10-22 ENCOUNTER — Telehealth: Payer: BC Managed Care – PPO | Admitting: Physician Assistant

## 2023-10-22 NOTE — Progress Notes (Signed)
Appt 10/29/2023 to discuss MRI results

## 2023-10-29 ENCOUNTER — Ambulatory Visit (INDEPENDENT_AMBULATORY_CARE_PROVIDER_SITE_OTHER): Payer: BC Managed Care – PPO | Admitting: Physician Assistant

## 2023-10-29 VITALS — Ht 60.0 in

## 2023-10-29 DIAGNOSIS — R413 Other amnesia: Secondary | ICD-10-CM | POA: Diagnosis not present

## 2023-10-29 DIAGNOSIS — R4189 Other symptoms and signs involving cognitive functions and awareness: Secondary | ICD-10-CM | POA: Diagnosis not present

## 2023-10-29 NOTE — Progress Notes (Signed)
Assessment/Plan:   Subjective Memory Loss   [name] is a very pleasant 57 y.o. RH female hypothyroidism, GERD, hyperlipidemia, SLE, history of RLE DVTon coumadin, history of infarction of spleen, tobacco abuse, vitamin D deficiency, anxiety  seen today in follow up to discuss the MRI of the brain results. These were personally reviewed, with normal findings.  No acute abnormalities.  She was last seen on 08/24/2023, with a MoCA of 24/30. This patient is here alone.  Previous records as well as any outside records available were reviewed prior to todays visit.     In view of no neurological abnormalities, no follow-up is indicated at this time Discontinue smoking Recommend psychotherapy for anxiety. Continue to control mood as per PCP Recommend good control of cardiovascular risk factors.  Follow-up with cardiology Follow-up with PCP hypothyroidism and other primary care issues.  Initial visit 08/24/2023    How long did patient have memory difficulties? For the last 2 years when she started a new job and found it hard to comprehend the instructions "nothing was sinking in", lately, since her job has become more complex and she has more anxiety which affects her memory. Reports some difficulty remembering new information, conversations and names.She finds herself unable to finish the task at times.  "At times, I read  but if someone interrupts me and my brain will not go any further ". She has to stop to think about how to pronounce the word and has to use a different word.  At work, she may grab the wrong file not realizing what she did.  She is unsure if she has any attention deficit . She does not do any brain games.  Long-term memory is good but "I don't really look back".   repeats oneself?  Endorsed, for the last 2 years Disoriented when walking into a room?  Patient denies Leaving objects in unusual places? "Everybody does".   Wandering behavior?  Denies Any personality changes?   Denies.   Any history of depression?:  Denies   Hallucinations or paranoia?  Denies   Seizures?  Denies    Any sleep changes? "Does not sleep well, I do not feel rested upon waking up, but for the last year, since starting this work from home, but I like my job". Denies vivid dreams, REM behavior or sleepwalking   Sleep apnea?  Denies   Any hygiene concerns?  Denies   Independent of bathing and dressing? Endorsed  Does the patient needs help with medications? Patient is in charge. I have been missing meds but it is because I do not have a routine" "Monday to Friday I might remember because I have the meds next to the computer, but Sat and Sunday I don't"  Who is in charge of the finances? Patient is in charge.      Any changes in appetite?  Denies     Patient have trouble swallowing? Denies.   Does the patient cook?  Not much. Denies any cooking accidents. Any kitchen accidents such as leaving the stove on? Denies.   Any history of headaches?  Denies.   Chronic pain ? Denies.   Ambulates with difficulty?  Denies.   Recent falls or head injuries? Denies.   Vision changes? Denies.   Unilateral weakness, numbness or tingling? Denies.   Any tremors?   Denies.   Any anosmia?  Denies.   Any incontinence of urine? Denies.   Any bowel dysfunction? Denies.      Patient  lives with husband and 2 daughters and her son. History of heavy alcohol intake? Denies.   History of heavy tobacco use? Initially quit at age 60, then resumed about 4 years ago.  Family history of dementia?  Maternal grandmother AD  Does patient drive? Yes, denies any issues, denies getting lost    CURRENT MEDICATIONS:  Outpatient Encounter Medications as of 10/29/2023  Medication Sig   atorvastatin (LIPITOR) 80 MG tablet Take 1 tablet (80 mg total) by mouth daily.   ezetimibe (ZETIA) 10 MG tablet Take 1 tablet (10 mg total) by mouth daily.   hydroxychloroquine (PLAQUENIL) 200 MG tablet Take 400 mg by mouth daily.    levothyroxine (SYNTHROID) 125 MCG tablet Take 1 tablet (125 mcg total) by mouth daily before breakfast.   warfarin (COUMADIN) 7.5 MG tablet Take 7.5 mg by mouth daily.   No facility-administered encounter medications on file as of 10/29/2023.        No data to display            08/24/2023   12:00 PM  Montreal Cognitive Assessment   Visuospatial/ Executive (0/5) 3  Naming (0/3) 3  Attention: Read list of digits (0/2) 2  Attention: Read list of letters (0/1) 1  Attention: Serial 7 subtraction starting at 100 (0/3) 3  Language: Repeat phrase (0/2) 2  Language : Fluency (0/1) 0  Abstraction (0/2) 0  Delayed Recall (0/5) 4  Orientation (0/6) 6  Total 24  Adjusted Score (based on education) 24   Thank you for allowing [place] the opportunity to participate in the care of this nice patient. Please do not hesitate to contact us for any questions or concerns.   Total time spent on today's visit was 27 minutes dedicated to this patient today, preparing to see patient, examining the patient, ordering tests and/or medications and counseling the patient, documenting clinical information in the EHR or other health record, independently interpreting results and communicating results to the patient/family, discussing treatment and goals, answering patient's questions and coordinating care.  Cc:  Candice Camp, MD  Marlowe Kays 10/29/2023 6:32 AM

## 2023-10-29 NOTE — Patient Instructions (Signed)
It was a pleasure to see you today at our office.   Recommendations:  Increase brain games activities Take care of the thyroid Agree with Cardiology  Recommend psychotherapy for anxiety      For psychiatric meds, mood meds: Please have your primary care physician manage these medications.  If you have any severe symptoms of a stroke, or other severe issues such as confusion,severe chills or fever, etc call 911 or go to the ER as you may need to be evaluated further    For assessment of decision of mental capacity and competency:  Call Dr. Erick Blinks, geriatric psychiatrist at [phone]    Whom to call: Memory  decline, memory medications: Call our office [phone]    [email or web address]   RECOMMENDATIONS FOR ALL PATIENTS WITH MEMORY PROBLEMS: 1. Continue to exercise (Recommend 30 minutes of walking everyday, or 3 hours every week) 2. Increase social interactions - continue going to [place] and enjoy social gatherings with friends and family 3. Eat healthy, avoid fried foods and eat more fruits and vegetables 4. Maintain adequate blood pressure, blood sugar, and blood cholesterol level. Reducing the risk of stroke and cardiovascular disease also helps promoting better memory. 5. Avoid stressful situations. Live a simple life and avoid aggravations. Organize your time and prepare for the next day in anticipation. 6. Sleep well, avoid any interruptions of sleep and avoid any distractions in the bedroom that may interfere with adequate sleep quality 7. Avoid sugar, avoid sweets as there is a strong link between excessive sugar intake, diabetes, and cognitive impairment We discussed the Mediterranean diet, which has been shown to help patients reduce the risk of progressive memory disorders and reduces cardiovascular risk. This includes eating fish, eat fruits and green leafy vegetables, nuts like almonds and hazelnuts,  walnuts, and also use olive oil. Avoid fast foods and fried foods as much as possible. Avoid sweets and sugar as sugar use has been linked to worsening of memory function.  There is always a concern of gradual progression of memory problems. If this is the case, then we may need to adjust level of care according to patient needs. Support, both to the patient and caregiver, should then be put into place.         DRIVING: Regarding driving, in patients with progressive memory problems, driving will be impaired. We advise to have someone else do the driving if trouble finding directions or if minor accidents are reported. Independent driving assessment is available to determine safety of driving.   If you are interested in the driving assessment, you can contact the following:  [employer] in [place] [phone]  [facility] [phone]  [facility] [phone]  [facility] [phone] or [phone]   FALL PRECAUTIONS: Be cautious when walking. Scan the area for obstacles that may increase the risk of trips and falls. When getting up in the mornings, sit up at the edge of the bed for a few minutes before getting out of bed. Consider elevating the bed at the head end to avoid drop of blood pressure when getting up. Walk always in a well-lit room (use night lights in the walls). Avoid area rugs or power cords from appliances in the middle of the walkways. Use a walker or a cane if necessary and consider physical therapy for balance exercise. Get your eyesight checked regularly.  FINANCIAL OVERSIGHT: Supervision, especially oversight when making financial decisions or transactions is also recommended.  HOME SAFETY: Consider the safety of  the kitchen when operating appliances like stoves, microwave oven, and blender. Consider having supervision and share cooking responsibilities until no longer able to participate in those. Accidents with  firearms and other hazards in the house should be identified and addressed as well.   ABILITY TO BE LEFT ALONE: If patient is unable to contact 911 operator, consider using LifeLine, or when the need is there, arrange for someone to stay with patients. Smoking is a fire hazard, consider supervision or cessation. Risk of wandering should be assessed by caregiver and if detected at any point, supervision and safe proof recommendations should be instituted.  MEDICATION SUPERVISION: Inability to self-administer medication needs to be constantly addressed. Implement a mechanism to ensure safe administration of the medications.      Mediterranean Diet A Mediterranean diet refers to food and lifestyle choices that are based on the traditions of countries located on the [employer]. This way of eating has been shown to help prevent certain conditions and improve outcomes for people who have chronic diseases, like kidney disease and heart disease. What are tips for following this plan? Lifestyle  Cook and eat meals together with your family, when possible. Drink enough fluid to keep your urine clear or pale yellow. Be physically active every day. This includes: Aerobic exercise like running or swimming. Leisure activities like gardening, walking, or housework. Get 7-8 hours of sleep each night. If recommended by your health care provider, drink red wine in moderation. This means 1 glass a day for nonpregnant women and 2 glasses a day for men. A glass of wine equals 5 oz (150 mL). Reading food labels  Check the serving size of packaged foods. For foods such as rice and pasta, the serving size refers to the amount of cooked product, not dry. Check the total fat in packaged foods. Avoid foods that have saturated fat or trans fats. Check the ingredients list for added sugars, such as corn syrup. Shopping  At the grocery store, buy most of your food from the areas near the walls of the store. This  includes: Fresh fruits and vegetables (produce). Grains, beans, nuts, and seeds. Some of these may be available in unpackaged forms or large amounts (in bulk). Fresh seafood. Poultry and eggs. Low-fat dairy products. Buy whole ingredients instead of prepackaged foods. Buy fresh fruits and vegetables in-season from local farmers markets. Buy frozen fruits and vegetables in resealable bags. If you do not have access to quality fresh seafood, buy precooked frozen shrimp or canned fish, such as tuna, salmon, or sardines. Buy small amounts of raw or cooked vegetables, salads, or olives from the deli or salad bar at your store. Stock your pantry so you always have certain foods on hand, such as olive oil, canned tuna, canned tomatoes, rice, pasta, and beans. Cooking  Cook foods with extra-virgin olive oil instead of using butter or other vegetable oils. Have meat as a side dish, and have vegetables or grains as your main dish. This means having meat in small portions or adding small amounts of meat to foods like pasta or stew. Use beans or vegetables instead of meat in common dishes like chili or lasagna. Experiment with different cooking methods. Try roasting or broiling vegetables instead of steaming or sauteing them. Add frozen vegetables to soups, stews, pasta, or rice. Add nuts or seeds for added healthy fat at each meal. You can add these to yogurt, salads, or vegetable dishes. Marinate fish or vegetables using olive oil, lemon juice, garlic,  and fresh herbs. Meal planning  Plan to eat 1 vegetarian meal one day each week. Try to work up to 2 vegetarian meals, if possible. Eat seafood 2 or more times a week. Have healthy snacks readily available, such as: Vegetable sticks with hummus. Greek yogurt. Fruit and nut trail mix. Eat balanced meals throughout the week. This includes: Fruit: 2-3 servings a day Vegetables: 4-5 servings a day Low-fat dairy: 2 servings a day Fish, poultry, or  lean meat: 1 serving a day Beans and legumes: 2 or more servings a week Nuts and seeds: 1-2 servings a day Whole grains: 6-8 servings a day Extra-virgin olive oil: 3-4 servings a day Limit red meat and sweets to only a few servings a month What are my food choices? Mediterranean diet Recommended Grains: Whole-grain pasta. Brown rice. Bulgar wheat. Polenta. Couscous. Whole-wheat bread. Orpah Cobb. Vegetables: Artichokes. Beets. Broccoli. Cabbage. Carrots. Eggplant. Green beans. Chard. Kale. Spinach. Onions. Leeks. Peas. Squash. Tomatoes. Peppers. Radishes. Fruits: Apples. Apricots. Avocado. Berries. Bananas. Cherries. Dates. Figs. Grapes. Lemons. Melon. Oranges. Peaches. Plums. Pomegranate. Meats and other protein foods: Beans. Almonds. Sunflower seeds. Pine nuts. Peanuts. Cod. Salmon. Scallops. Shrimp. Tuna. Tilapia. Clams. Oysters. Eggs. Dairy: Low-fat milk. Cheese. Greek yogurt. Beverages: Water. Red wine. Herbal tea. Fats and oils: Extra virgin olive oil. Avocado oil. Grape seed oil. Sweets and desserts: [place] yogurt with honey. Baked apples. Poached pears. Trail mix. Seasoning and other foods: Basil. Cilantro. Coriander. Cumin. Mint. Parsley. Sage. Rosemary. Tarragon. Garlic. Oregano. Thyme. Pepper. Balsalmic vinegar. Tahini. Hummus. Tomato sauce. Olives. Mushrooms. Limit these Grains: Prepackaged pasta or rice dishes. Prepackaged cereal with added sugar. Vegetables: Deep fried potatoes (french fries). Fruits: Fruit canned in syrup. Meats and other protein foods: Beef. Pork. Lamb. Poultry with skin. Hot dogs. Tomasa Blase. Dairy: Ice cream. Sour cream. Whole milk. Beverages: Juice. Sugar-sweetened soft drinks. Beer. Liquor and spirits. Fats and oils: Butter. Canola oil. Vegetable oil. Beef fat (tallow). Lard. Sweets and desserts: Cookies. Cakes. Pies. Candy. Seasoning and other foods: Mayonnaise. Premade sauces and marinades. The items listed may not be a complete list. Talk with your  dietitian about what dietary choices are right for you. Summary The Mediterranean diet includes both food and lifestyle choices. Eat a variety of fresh fruits and vegetables, beans, nuts, seeds, and whole grains. Limit the amount of red meat and sweets that you eat. Talk with your health care provider about whether it is safe for you to drink red wine in moderation. This means 1 glass a day for nonpregnant women and 2 glasses a day for men. A glass of wine equals 5 oz (150 mL). This information is not intended to replace advice given to you by your health care provider. Make sure you discuss any questions you have with your health care provider. Document Released: 07/16/2016 Document Revised: 08/18/2016 Document Reviewed: 07/16/2016 Elsevier Interactive Patient Education  2017 [employer].   [address] for labs [facility] [phone]

## 2023-10-31 NOTE — Progress Notes (Unsigned)
Cardiology Office Note:    Date:  11/02/2023   ID:  [name], DOB 1966/03/14, MRN [id number]  PCP:  Candice Camp, MD   [name] Cardiologist:  Meriam Sprague, MD (Inactive)     Referring MD: Candice Camp, MD   Chief Complaint  Patient presents with   Coronary Artery Disease    History of Present Illness:    [name] is a 57 y.o. female seen for cardiology follow up. Former patient of Dr Shari Prows. History of SLE with lupus anticoagulant and prior splenic infarct. On chronic coumadin. History of HLD and tobacco abuse. Coronary calcium score of 102 in 2021. Normal ETT in 2018. Echo in April 2023 was normal. Also with family history of CAD with sister passing away at age 74 with SCD On follow up today she is doing well. No chest pain. Does have some dyspnea at times which she attributes to anxiety. Only smokes socially a couple of times a month. Did run out of her medication which is reflected on her lab work in Sept.   Past Medical History:  Diagnosis Date   Hyperlipidemia    Lupus    Thyroid disease     Past Surgical History:  Procedure Laterality Date   BREAST ENHANCEMENT SURGERY Bilateral    CESAREAN SECTION     CHOLECYSTECTOMY     gallbladder removed     TOTAL ABDOMINAL HYSTERECTOMY     TUBAL LIGATION      Current Medications: Current Meds  Medication Sig   atorvastatin (LIPITOR) 80 MG tablet Take 1 tablet (80 mg total) by mouth daily.   ezetimibe (ZETIA) 10 MG tablet Take 1 tablet (10 mg total) by mouth daily.   hydroxychloroquine (PLAQUENIL) 200 MG tablet Take 400 mg by mouth daily.   warfarin (COUMADIN) 7.5 MG tablet Take 7.5 mg by mouth daily.   [DISCONTINUED] levothyroxine (SYNTHROID) 125 MCG tablet Take 1 tablet (125 mcg total) by mouth daily before breakfast.     Allergies:   Patient has no known allergies.   Social History   Socioeconomic History   Marital status: Married    Spouse name: Not on file   Number of  children: 5   Years of education: 14   Highest education level: Not on file  Occupational History   Not on file  Tobacco Use   Smoking status: Some Days    Types: Cigarettes   Smokeless tobacco: Never  Vaping Use   Vaping status: Never Used  Substance and Sexual Activity   Alcohol use: Yes    Comment: occasionally   Drug use: No   Sexual activity: Not on file  Other Topics Concern   Not on file  Social History Narrative   Right handed   Drinks caffiene   Two story home   Lives with husband and children   employed   Social Determinants of Health   Financial Resource Strain: Not on file  Food Insecurity: Not on file  Transportation Needs: Not on file  Physical Activity: Not on file  Stress: Not on file  Social Connections: Not on file     Family History: The patient's family history includes Cancer in her mother; Heart disease in her father.  ROS:   Please see the history of present illness.     All other systems reviewed and are negative.  EKGs/Labs/Other Studies Reviewed:    The following studies were reviewed today: ETT 04/22/17:Study Highlights    Blood pressure  demonstrated a normal response to exercise. There was no ST segment deviation noted during stress.   Normal ETT no ischemia  Coronary calcium score 12/02/20: Coronary Calcium Score   TECHNIQUE: The patient was scanned on a [employer]. Axial non-contrast 3 mm slices were carried out through the heart. The data set was analyzed on a dedicated work station and scored using the Agatson method.   FINDINGS: Non-cardiac: See separate report from [facility].   Ascending Aorta: Normal caliber. Mild calcifications of the ascending aorta.   Pericardium: Normal   Coronary arteries: Normal coronary origins. Coronary Calcifications in the RCA and LAD.   IMPRESSION: Coronary calcium score of 102 in the RCA and LAD. This was 96th percentile for age and sex matched control.    Scattered calcifications in the ascending aorta consistent with aortic atherosclerosis.   Recommend aggressive risk factor modifcation.   Echo 03/11/22: IMPRESSIONS     1. Left ventricular ejection fraction, by estimation, is 60 to 65%. The  left ventricle has normal function. The left ventricle has no regional  wall motion abnormalities. Left ventricular diastolic parameters were  normal. The average left ventricular  global longitudinal strain is -23.4 %. The global longitudinal strain is  normal.   2. Right ventricular systolic function is normal. The right ventricular  size is normal.   3. The mitral valve is abnormal. Trivial mitral valve regurgitation. No  evidence of mitral stenosis.   4. The aortic valve is tricuspid. There is mild calcification of the  aortic valve. Aortic valve regurgitation is not visualized. Aortic valve  sclerosis is present, with no evidence of aortic valve stenosis.   5. The inferior vena cava is normal in size with greater than 50%  respiratory variability, suggesting right atrial pressure of 3 mmHg.  EKG Interpretation Date/Time:  Tuesday November 02 2023 09:29:39 EST Ventricular Rate:  75 PR Interval:  170 QRS Duration:  94 QT Interval:  388 QTC Calculation: 433 R Axis:   40  Text Interpretation: Normal sinus rhythm Normal ECG When compared with ECG of 04-Jul-2006 20:36, No significant change was found Confirmed by [place], Zeph Riebel [phone]) on 11/02/2023 9:37:44 AM    Recent Labs: 08/24/2023: TSH 18.11  Recent Lipid Panel    Component Value Date/Time   CHOL 254 (H) 08/24/2023 0921   TRIG 297 (H) 08/24/2023 0921   HDL 40 08/24/2023 0921   CHOLHDL 6.4 (H) 08/24/2023 0921   LDLCALC 158 (H) 08/24/2023 0921   EKG Interpretation Date/Time:  Tuesday November 02 2023 09:29:39 EST Ventricular Rate:  75 PR Interval:  170 QRS Duration:  94 QT Interval:  388 QTC Calculation: 433 R Axis:   40  Text Interpretation: Normal sinus rhythm Normal ECG  When compared with ECG of 04-Jul-2006 20:36, No significant change was found Confirmed by [place], Sheran Newstrom [phone]) on 11/02/2023 9:37:44 AM    Risk Assessment/Calculations:                Physical Exam:    VS:  BP 136/80 (BP Location: Left Arm, Patient Position: Sitting, Cuff Size: Normal)   Pulse 75   Ht 5' (1.524 m)   Wt 140 lb (63.5 kg)   SpO2 97%   BMI 27.34 kg/m     Wt Readings from Last 3 Encounters:  11/02/23 140 lb (63.5 kg)  08/24/23 140 lb (63.5 kg)  10/05/22 152 lb 12.8 oz (69.3 kg)     GEN:  Well nourished, well developed in no acute distress HEENT:  Normal NECK: No JVD; No carotid bruits LYMPHATICS: No lymphadenopathy CARDIAC: RRR, no murmurs, rubs, gallops RESPIRATORY:  Clear to auscultation without rales, wheezing or rhonchi  ABDOMEN: Soft, non-tender, non-distended MUSCULOSKELETAL:  No edema; No deformity  SKIN: Warm and dry NEUROLOGIC:  Alert and oriented x 3 PSYCHIATRIC:  Normal affect   ASSESSMENT:    1. Coronary artery disease involving native coronary artery of native heart without angina pectoris   2. Hyperlipidemia, unspecified hyperlipidemia type   3. Family history of early CAD   4. Hypothyroidism, unspecified type    PLAN:    In order of problems listed above:  Coronary Calcification: Ca score 102 which is the 96% for age and sex matched controls. Now on aggressive risk factor modification. Doing well without anginal symptoms. -Continue lipitor 80mg  daily -Continue zetia 10mg  daily -Goal LDL<70 -Smoking cessation -Lifestyle modifications as detailed below - will repeat labs in 3 months back on meds.   2. HLD as noted   3. SLE with history of splenic infarct. Managed by hematology now on lifelong [facility] with warfarin. No further arterial or venous thrombosis since starting AC. -Continue lifelong warfarin -Management per Hematology   4. DOE: Likely secondary to prolonged smoking history. TTE with LVEF 60-65%, normal RV, no significant  valve disease.    5. History of Tobacco abuse: Encourage complete cessation   6. Hypothyroidism: -Continue synthroid- renewed - really needs  PCP to follow this - will recheck TSH in 3 months   7. Family history of SCD           Medication Adjustments/Labs and Tests Ordered: Current medicines are reviewed at length with the patient today.  Concerns regarding medicines are outlined above.  Orders Placed This Encounter  Procedures   EKG 12-Lead   Meds ordered this encounter  Medications   levothyroxine (SYNTHROID) 125 MCG tablet    Sig: Take 1 tablet (125 mcg total) by mouth daily before breakfast.    Dispense:  90 tablet    Refill:  1    There are no Patient Instructions on file for this visit.   Signed, Christiane Sistare [place], MD  11/02/2023 9:46 AM    [facility]

## 2023-11-02 ENCOUNTER — Encounter: Payer: Self-pay | Admitting: Cardiology

## 2023-11-02 ENCOUNTER — Ambulatory Visit: Payer: BC Managed Care – PPO | Attending: Cardiology | Admitting: Cardiology

## 2023-11-02 VITALS — BP 136/80 | HR 75 | Ht 60.0 in | Wt 140.0 lb

## 2023-11-02 DIAGNOSIS — I2510 Atherosclerotic heart disease of native coronary artery without angina pectoris: Secondary | ICD-10-CM

## 2023-11-02 DIAGNOSIS — E039 Hypothyroidism, unspecified: Secondary | ICD-10-CM | POA: Diagnosis not present

## 2023-11-02 DIAGNOSIS — E785 Hyperlipidemia, unspecified: Secondary | ICD-10-CM

## 2023-11-02 DIAGNOSIS — Z8249 Family history of ischemic heart disease and other diseases of the circulatory system: Secondary | ICD-10-CM

## 2023-11-02 MED ORDER — LEVOTHYROXINE SODIUM 125 MCG PO TABS: 125.0000 ug | ORAL_TABLET | Freq: Every day | ORAL | 1 refills | Status: DC

## 2023-11-02 NOTE — Addendum Note (Signed)
Addended by: Marlowe Kays E on: 11/02/2023 05:28 PM   Modules accepted: Level of Service

## 2023-11-02 NOTE — Patient Instructions (Signed)
Medication Instructions:  Continue all medications *If you need a refill on your cardiac medications before your next appointment, please call your pharmacy*   Lab Work: Bmet,lipid and hepatic panels,tsh to be done in 3 months   Testing/Procedures: None ordered   Follow-Up: At [facility], you and your health needs are our priority.  As part of our continuing mission to provide you with exceptional heart care, we have created designated Provider Care Teams.  These Care Teams include your primary Cardiologist (physician) and Advanced Practice Providers (APPs -  Physician Assistants and Nurse Practitioners) who all work together to provide you with the care you need, when you need it.  We recommend signing up for the patient portal called "MyChart".  Sign up information is provided on this After Visit Summary.  MyChart is used to connect with patients for Virtual Visits (Telemedicine).  Patients are able to view lab/test results, encounter notes, upcoming appointments, etc.  Non-urgent messages can be sent to your provider as well.   To learn more about what you can do with MyChart, go to [email or web address].    Your next appointment:  1 year   Call in July to schedule Nov appointment     Provider:  Dr.Jordan

## 2023-11-03 DIAGNOSIS — R051 Acute cough: Secondary | ICD-10-CM | POA: Diagnosis not present

## 2023-11-03 DIAGNOSIS — J040 Acute laryngitis: Secondary | ICD-10-CM | POA: Diagnosis not present

## 2023-11-03 DIAGNOSIS — R0981 Nasal congestion: Secondary | ICD-10-CM | POA: Diagnosis not present

## 2023-11-15 DIAGNOSIS — Z7901 Long term (current) use of anticoagulants: Secondary | ICD-10-CM | POA: Diagnosis not present

## 2023-12-20 DIAGNOSIS — Z7901 Long term (current) use of anticoagulants: Secondary | ICD-10-CM | POA: Diagnosis not present

## 2023-12-22 DIAGNOSIS — F900 Attention-deficit hyperactivity disorder, predominantly inattentive type: Secondary | ICD-10-CM | POA: Diagnosis not present

## 2024-01-19 DIAGNOSIS — Z7901 Long term (current) use of anticoagulants: Secondary | ICD-10-CM | POA: Diagnosis not present

## 2024-02-01 DIAGNOSIS — Z7901 Long term (current) use of anticoagulants: Secondary | ICD-10-CM | POA: Diagnosis not present

## 2024-02-02 DIAGNOSIS — Z7901 Long term (current) use of anticoagulants: Secondary | ICD-10-CM | POA: Diagnosis not present

## 2024-02-04 DIAGNOSIS — E039 Hypothyroidism, unspecified: Secondary | ICD-10-CM | POA: Diagnosis not present

## 2024-02-04 DIAGNOSIS — F900 Attention-deficit hyperactivity disorder, predominantly inattentive type: Secondary | ICD-10-CM | POA: Diagnosis not present

## 2024-03-16 DIAGNOSIS — Z7901 Long term (current) use of anticoagulants: Secondary | ICD-10-CM | POA: Diagnosis not present

## 2024-04-03 DIAGNOSIS — F4322 Adjustment disorder with anxiety: Secondary | ICD-10-CM | POA: Diagnosis not present

## 2024-04-11 DIAGNOSIS — F4322 Adjustment disorder with anxiety: Secondary | ICD-10-CM | POA: Diagnosis not present

## 2024-04-20 DIAGNOSIS — Z7901 Long term (current) use of anticoagulants: Secondary | ICD-10-CM | POA: Diagnosis not present

## 2024-04-27 DIAGNOSIS — Z7901 Long term (current) use of anticoagulants: Secondary | ICD-10-CM | POA: Diagnosis not present

## 2024-05-04 DIAGNOSIS — Z7901 Long term (current) use of anticoagulants: Secondary | ICD-10-CM | POA: Diagnosis not present

## 2024-05-10 DIAGNOSIS — M79644 Pain in right finger(s): Secondary | ICD-10-CM | POA: Diagnosis not present

## 2024-05-10 DIAGNOSIS — M329 Systemic lupus erythematosus, unspecified: Secondary | ICD-10-CM | POA: Diagnosis not present

## 2024-05-10 DIAGNOSIS — M7061 Trochanteric bursitis, right hip: Secondary | ICD-10-CM | POA: Diagnosis not present

## 2024-05-10 DIAGNOSIS — R110 Nausea: Secondary | ICD-10-CM | POA: Diagnosis not present

## 2024-05-12 DIAGNOSIS — F39 Unspecified mood [affective] disorder: Secondary | ICD-10-CM | POA: Diagnosis not present

## 2024-05-12 DIAGNOSIS — E039 Hypothyroidism, unspecified: Secondary | ICD-10-CM | POA: Diagnosis not present

## 2024-05-12 DIAGNOSIS — F900 Attention-deficit hyperactivity disorder, predominantly inattentive type: Secondary | ICD-10-CM | POA: Diagnosis not present

## 2024-05-21 ENCOUNTER — Other Ambulatory Visit: Payer: Self-pay | Admitting: Cardiology

## 2024-05-30 DIAGNOSIS — Z7901 Long term (current) use of anticoagulants: Secondary | ICD-10-CM | POA: Diagnosis not present

## 2024-06-11 DIAGNOSIS — R1031 Right lower quadrant pain: Secondary | ICD-10-CM | POA: Diagnosis not present

## 2024-06-11 DIAGNOSIS — R0789 Other chest pain: Secondary | ICD-10-CM | POA: Diagnosis not present

## 2024-06-11 DIAGNOSIS — W19XXXA Unspecified fall, initial encounter: Secondary | ICD-10-CM | POA: Diagnosis not present

## 2024-06-12 DIAGNOSIS — Z7901 Long term (current) use of anticoagulants: Secondary | ICD-10-CM | POA: Diagnosis not present

## 2024-06-15 DIAGNOSIS — W19XXXA Unspecified fall, initial encounter: Secondary | ICD-10-CM | POA: Diagnosis not present

## 2024-06-15 DIAGNOSIS — R109 Unspecified abdominal pain: Secondary | ICD-10-CM | POA: Diagnosis not present

## 2024-06-15 DIAGNOSIS — S301XXA Contusion of abdominal wall, initial encounter: Secondary | ICD-10-CM | POA: Diagnosis not present

## 2024-06-15 DIAGNOSIS — R0781 Pleurodynia: Secondary | ICD-10-CM | POA: Diagnosis not present

## 2024-06-15 DIAGNOSIS — R0789 Other chest pain: Secondary | ICD-10-CM | POA: Diagnosis not present

## 2024-06-15 DIAGNOSIS — W1809XA Striking against other object with subsequent fall, initial encounter: Secondary | ICD-10-CM | POA: Diagnosis not present

## 2024-07-02 DIAGNOSIS — X501XXA Overexertion from prolonged static or awkward postures, initial encounter: Secondary | ICD-10-CM | POA: Diagnosis not present

## 2024-07-02 DIAGNOSIS — S82445A Nondisplaced spiral fracture of shaft of left fibula, initial encounter for closed fracture: Secondary | ICD-10-CM | POA: Diagnosis not present

## 2024-07-02 DIAGNOSIS — S82892A Other fracture of left lower leg, initial encounter for closed fracture: Secondary | ICD-10-CM | POA: Diagnosis not present

## 2024-07-02 DIAGNOSIS — X58XXXA Exposure to other specified factors, initial encounter: Secondary | ICD-10-CM | POA: Diagnosis not present

## 2024-07-02 DIAGNOSIS — Y9389 Activity, other specified: Secondary | ICD-10-CM | POA: Diagnosis not present

## 2024-07-02 DIAGNOSIS — S82402A Unspecified fracture of shaft of left fibula, initial encounter for closed fracture: Secondary | ICD-10-CM | POA: Diagnosis not present

## 2024-07-02 DIAGNOSIS — M79672 Pain in left foot: Secondary | ICD-10-CM | POA: Diagnosis not present

## 2024-07-02 DIAGNOSIS — M25572 Pain in left ankle and joints of left foot: Secondary | ICD-10-CM | POA: Diagnosis not present

## 2024-07-02 DIAGNOSIS — M79662 Pain in left lower leg: Secondary | ICD-10-CM | POA: Diagnosis not present

## 2024-07-02 DIAGNOSIS — M25562 Pain in left knee: Secondary | ICD-10-CM | POA: Diagnosis not present

## 2024-07-06 DIAGNOSIS — S99912A Unspecified injury of left ankle, initial encounter: Secondary | ICD-10-CM | POA: Diagnosis not present

## 2024-07-06 DIAGNOSIS — S82892A Other fracture of left lower leg, initial encounter for closed fracture: Secondary | ICD-10-CM | POA: Diagnosis not present

## 2024-07-07 ENCOUNTER — Telehealth: Payer: Self-pay

## 2024-07-07 NOTE — Telephone Encounter (Signed)
   Pre-operative Risk Assessment    Patient Name: [name]  DOB: [birth year] MRN: [id number]   Date of last office visit: 11/02/23 PETER SWAZILAND, MD Date of next office visit: NONE   Request for Surgical Clearance    Procedure:  OPEN REDUCTION AND INTERNAL FIXATION LEFT ANKLE  Date of Surgery:  Clearance TBD                                Surgeon:  REYES BOER, MD Surgeon's Group or Practice Name:  [facility] Phone number:  [phone] Fax number:  [phone]   Type of Clearance Requested:   - Medical  - Pharmacy:  Hold Warfarin (Coumadin)     Type of Anesthesia:  General    Additional requests/questions:    SignedLucie DELENA Ku   07/07/2024, 8:00 AM

## 2024-07-10 DIAGNOSIS — M329 Systemic lupus erythematosus, unspecified: Secondary | ICD-10-CM | POA: Diagnosis not present

## 2024-07-10 DIAGNOSIS — Z7901 Long term (current) use of anticoagulants: Secondary | ICD-10-CM | POA: Diagnosis not present

## 2024-07-10 DIAGNOSIS — S82892A Other fracture of left lower leg, initial encounter for closed fracture: Secondary | ICD-10-CM | POA: Diagnosis not present

## 2024-07-13 DIAGNOSIS — S82892A Other fracture of left lower leg, initial encounter for closed fracture: Secondary | ICD-10-CM | POA: Diagnosis not present

## 2024-07-18 DIAGNOSIS — S82832A Other fracture of upper and lower end of left fibula, initial encounter for closed fracture: Secondary | ICD-10-CM | POA: Diagnosis not present

## 2024-07-18 DIAGNOSIS — Z86718 Personal history of other venous thrombosis and embolism: Secondary | ICD-10-CM | POA: Diagnosis not present

## 2024-07-18 DIAGNOSIS — F419 Anxiety disorder, unspecified: Secondary | ICD-10-CM | POA: Diagnosis not present

## 2024-07-18 DIAGNOSIS — W16512A Jumping or diving into swimming pool striking water surface causing other injury, initial encounter: Secondary | ICD-10-CM | POA: Diagnosis not present

## 2024-07-18 DIAGNOSIS — R0683 Snoring: Secondary | ICD-10-CM | POA: Diagnosis not present

## 2024-07-18 DIAGNOSIS — R1310 Dysphagia, unspecified: Secondary | ICD-10-CM | POA: Diagnosis not present

## 2024-07-18 DIAGNOSIS — M329 Systemic lupus erythematosus, unspecified: Secondary | ICD-10-CM | POA: Diagnosis not present

## 2024-07-18 DIAGNOSIS — I2510 Atherosclerotic heart disease of native coronary artery without angina pectoris: Secondary | ICD-10-CM | POA: Diagnosis not present

## 2024-07-18 DIAGNOSIS — G8918 Other acute postprocedural pain: Secondary | ICD-10-CM | POA: Diagnosis not present

## 2024-07-18 DIAGNOSIS — S82842A Displaced bimalleolar fracture of left lower leg, initial encounter for closed fracture: Secondary | ICD-10-CM | POA: Diagnosis not present

## 2024-07-18 DIAGNOSIS — E785 Hyperlipidemia, unspecified: Secondary | ICD-10-CM | POA: Diagnosis not present

## 2024-07-18 DIAGNOSIS — K219 Gastro-esophageal reflux disease without esophagitis: Secondary | ICD-10-CM | POA: Diagnosis not present

## 2024-07-18 DIAGNOSIS — Z79899 Other long term (current) drug therapy: Secondary | ICD-10-CM | POA: Diagnosis not present

## 2024-07-18 DIAGNOSIS — F17210 Nicotine dependence, cigarettes, uncomplicated: Secondary | ICD-10-CM | POA: Diagnosis not present

## 2024-07-18 DIAGNOSIS — E039 Hypothyroidism, unspecified: Secondary | ICD-10-CM | POA: Diagnosis not present

## 2024-07-26 DIAGNOSIS — Z7901 Long term (current) use of anticoagulants: Secondary | ICD-10-CM | POA: Diagnosis not present

## 2024-07-26 NOTE — Telephone Encounter (Signed)
 I s/w the pt and she tells me that her surgery was done 07/18/24. I apologized that this just sent to me. Pt said that is ok. I apologized for my call and that I will update the preop APP surgery has been done.

## 2024-07-26 NOTE — Telephone Encounter (Signed)
   Name: [name]  DOB: 10/20/1966  MRN: [id number]  Primary Cardiologist: None   Preoperative team, please contact this patient and set up a phone call appointment for further preoperative risk assessment. Please obtain consent and complete medication review. Thank you for your help.  I confirm that guidance regarding antiplatelet and oral anticoagulation therapy has been completed and, if necessary, noted below.  Coumadin is managed by hematology.  I also confirmed the patient resides in the state of [place] . As per [facility] Medical Board telemedicine laws, the patient must reside in the state in which the provider is licensed.   [name], NP 07/26/2024, 8:08 AM [facility]

## 2024-07-31 DIAGNOSIS — M25572 Pain in left ankle and joints of left foot: Secondary | ICD-10-CM | POA: Diagnosis not present

## 2024-07-31 DIAGNOSIS — I2510 Atherosclerotic heart disease of native coronary artery without angina pectoris: Secondary | ICD-10-CM | POA: Diagnosis not present

## 2024-07-31 DIAGNOSIS — Z7901 Long term (current) use of anticoagulants: Secondary | ICD-10-CM | POA: Diagnosis not present

## 2024-07-31 DIAGNOSIS — S82892A Other fracture of left lower leg, initial encounter for closed fracture: Secondary | ICD-10-CM | POA: Diagnosis not present

## 2024-07-31 DIAGNOSIS — S99912A Unspecified injury of left ankle, initial encounter: Secondary | ICD-10-CM | POA: Diagnosis not present

## 2024-08-13 DIAGNOSIS — S82892A Other fracture of left lower leg, initial encounter for closed fracture: Secondary | ICD-10-CM | POA: Diagnosis not present

## 2024-08-15 ENCOUNTER — Other Ambulatory Visit: Payer: Self-pay | Admitting: Internal Medicine

## 2024-08-22 DIAGNOSIS — M25672 Stiffness of left ankle, not elsewhere classified: Secondary | ICD-10-CM | POA: Diagnosis not present

## 2024-08-22 DIAGNOSIS — R2689 Other abnormalities of gait and mobility: Secondary | ICD-10-CM | POA: Diagnosis not present

## 2024-08-22 DIAGNOSIS — M25572 Pain in left ankle and joints of left foot: Secondary | ICD-10-CM | POA: Diagnosis not present

## 2024-08-28 DIAGNOSIS — S82892A Other fracture of left lower leg, initial encounter for closed fracture: Secondary | ICD-10-CM | POA: Diagnosis not present

## 2024-08-29 DIAGNOSIS — Z7901 Long term (current) use of anticoagulants: Secondary | ICD-10-CM | POA: Diagnosis not present

## 2024-08-29 DIAGNOSIS — R2689 Other abnormalities of gait and mobility: Secondary | ICD-10-CM | POA: Diagnosis not present

## 2024-08-29 DIAGNOSIS — M25672 Stiffness of left ankle, not elsewhere classified: Secondary | ICD-10-CM | POA: Diagnosis not present

## 2024-08-29 DIAGNOSIS — M25572 Pain in left ankle and joints of left foot: Secondary | ICD-10-CM | POA: Diagnosis not present

## 2024-08-31 DIAGNOSIS — M25672 Stiffness of left ankle, not elsewhere classified: Secondary | ICD-10-CM | POA: Diagnosis not present

## 2024-08-31 DIAGNOSIS — R2689 Other abnormalities of gait and mobility: Secondary | ICD-10-CM | POA: Diagnosis not present

## 2024-08-31 DIAGNOSIS — M25572 Pain in left ankle and joints of left foot: Secondary | ICD-10-CM | POA: Diagnosis not present

## 2024-09-05 DIAGNOSIS — M25572 Pain in left ankle and joints of left foot: Secondary | ICD-10-CM | POA: Diagnosis not present

## 2024-09-05 DIAGNOSIS — R2689 Other abnormalities of gait and mobility: Secondary | ICD-10-CM | POA: Diagnosis not present

## 2024-09-05 DIAGNOSIS — M25672 Stiffness of left ankle, not elsewhere classified: Secondary | ICD-10-CM | POA: Diagnosis not present

## 2024-09-06 DIAGNOSIS — Z7901 Long term (current) use of anticoagulants: Secondary | ICD-10-CM | POA: Diagnosis not present

## 2024-09-07 DIAGNOSIS — M25572 Pain in left ankle and joints of left foot: Secondary | ICD-10-CM | POA: Diagnosis not present

## 2024-09-07 DIAGNOSIS — M25672 Stiffness of left ankle, not elsewhere classified: Secondary | ICD-10-CM | POA: Diagnosis not present

## 2024-09-07 DIAGNOSIS — R2689 Other abnormalities of gait and mobility: Secondary | ICD-10-CM | POA: Diagnosis not present

## 2024-09-08 DIAGNOSIS — Z7901 Long term (current) use of anticoagulants: Secondary | ICD-10-CM | POA: Diagnosis not present

## 2024-09-11 DIAGNOSIS — M25572 Pain in left ankle and joints of left foot: Secondary | ICD-10-CM | POA: Diagnosis not present

## 2024-09-11 DIAGNOSIS — M25672 Stiffness of left ankle, not elsewhere classified: Secondary | ICD-10-CM | POA: Diagnosis not present

## 2024-09-11 DIAGNOSIS — R2689 Other abnormalities of gait and mobility: Secondary | ICD-10-CM | POA: Diagnosis not present

## 2024-09-12 DIAGNOSIS — S82892A Other fracture of left lower leg, initial encounter for closed fracture: Secondary | ICD-10-CM | POA: Diagnosis not present

## 2024-09-14 DIAGNOSIS — M25672 Stiffness of left ankle, not elsewhere classified: Secondary | ICD-10-CM | POA: Diagnosis not present

## 2024-09-14 DIAGNOSIS — R2689 Other abnormalities of gait and mobility: Secondary | ICD-10-CM | POA: Diagnosis not present

## 2024-09-14 DIAGNOSIS — M25572 Pain in left ankle and joints of left foot: Secondary | ICD-10-CM | POA: Diagnosis not present

## 2024-10-02 DIAGNOSIS — S82892A Other fracture of left lower leg, initial encounter for closed fracture: Secondary | ICD-10-CM | POA: Diagnosis not present

## 2024-10-10 ENCOUNTER — Institutional Professional Consult (permissible substitution): Payer: Self-pay

## 2024-10-11 ENCOUNTER — Telehealth: Payer: Self-pay

## 2024-10-11 ENCOUNTER — Institutional Professional Consult (permissible substitution): Payer: Self-pay

## 2024-10-11 NOTE — Telephone Encounter (Signed)
 called and lvmail, pt was a no show

## 2024-10-26 DIAGNOSIS — Z7901 Long term (current) use of anticoagulants: Secondary | ICD-10-CM | POA: Diagnosis not present

## 2024-10-31 DIAGNOSIS — Z7901 Long term (current) use of anticoagulants: Secondary | ICD-10-CM | POA: Diagnosis not present

## 2024-11-14 DIAGNOSIS — S82892A Other fracture of left lower leg, initial encounter for closed fracture: Secondary | ICD-10-CM | POA: Diagnosis not present

## 2024-11-16 ENCOUNTER — Other Ambulatory Visit: Payer: Self-pay | Admitting: Family Medicine

## 2024-11-16 DIAGNOSIS — Z1231 Encounter for screening mammogram for malignant neoplasm of breast: Secondary | ICD-10-CM

## 2024-11-16 DIAGNOSIS — M329 Systemic lupus erythematosus, unspecified: Secondary | ICD-10-CM | POA: Diagnosis not present

## 2024-11-16 DIAGNOSIS — E039 Hypothyroidism, unspecified: Secondary | ICD-10-CM | POA: Diagnosis not present

## 2024-11-16 DIAGNOSIS — Z79899 Other long term (current) drug therapy: Secondary | ICD-10-CM | POA: Diagnosis not present

## 2024-11-16 DIAGNOSIS — E785 Hyperlipidemia, unspecified: Secondary | ICD-10-CM | POA: Diagnosis not present

## 2024-11-27 DIAGNOSIS — Z7901 Long term (current) use of anticoagulants: Secondary | ICD-10-CM | POA: Diagnosis not present

## 2024-11-27 DIAGNOSIS — M79672 Pain in left foot: Secondary | ICD-10-CM | POA: Diagnosis not present

## 2024-11-27 DIAGNOSIS — E039 Hypothyroidism, unspecified: Secondary | ICD-10-CM | POA: Diagnosis not present

## 2024-11-27 DIAGNOSIS — E785 Hyperlipidemia, unspecified: Secondary | ICD-10-CM | POA: Diagnosis not present

## 2024-11-29 DIAGNOSIS — M7742 Metatarsalgia, left foot: Secondary | ICD-10-CM | POA: Diagnosis not present

## 2025-01-16 ENCOUNTER — Ambulatory Visit
